# Patient Record
Sex: Male | Born: 1950 | Race: Black or African American | Hispanic: No | Marital: Married | Smoking: Current every day smoker
Health system: Southern US, Community
[De-identification: ages and names within clinical notes are randomized; demographics above are authoritative.]

## PROBLEM LIST (undated history)

## (undated) DIAGNOSIS — I10 Essential (primary) hypertension: Secondary | ICD-10-CM

## (undated) DIAGNOSIS — J45909 Unspecified asthma, uncomplicated: Secondary | ICD-10-CM

## (undated) HISTORY — PX: ACHILLES TENDON SURGERY: SHX542

## (undated) HISTORY — PX: TONSILLECTOMY: SUR1361

## (undated) HISTORY — PX: COLON RESECTION: SHX5231

---

## 1997-11-26 ENCOUNTER — Emergency Department (HOSPITAL_COMMUNITY): Admission: EM | Admit: 1997-11-26 | Discharge: 1997-11-26 | Payer: Self-pay | Admitting: Emergency Medicine

## 1998-07-27 ENCOUNTER — Emergency Department (HOSPITAL_COMMUNITY): Admission: EM | Admit: 1998-07-27 | Discharge: 1998-07-27 | Payer: Self-pay

## 2000-11-15 ENCOUNTER — Emergency Department (HOSPITAL_COMMUNITY): Admission: EM | Admit: 2000-11-15 | Discharge: 2000-11-15 | Payer: Self-pay | Admitting: *Deleted

## 2000-11-17 ENCOUNTER — Encounter: Payer: Self-pay | Admitting: Specialist

## 2000-11-20 ENCOUNTER — Observation Stay (HOSPITAL_COMMUNITY): Admission: RE | Admit: 2000-11-20 | Discharge: 2000-11-21 | Payer: Self-pay | Admitting: Specialist

## 2006-09-27 ENCOUNTER — Inpatient Hospital Stay (HOSPITAL_COMMUNITY): Admission: RE | Admit: 2006-09-27 | Discharge: 2006-10-02 | Payer: Self-pay | Admitting: Surgery

## 2006-09-27 ENCOUNTER — Encounter (INDEPENDENT_AMBULATORY_CARE_PROVIDER_SITE_OTHER): Payer: Self-pay | Admitting: Specialist

## 2008-12-05 ENCOUNTER — Emergency Department (HOSPITAL_COMMUNITY): Admission: EM | Admit: 2008-12-05 | Discharge: 2008-12-05 | Payer: Self-pay | Admitting: Emergency Medicine

## 2010-10-29 NOTE — Discharge Summary (Signed)
NAME:  Kyle Vazquez, Kyle Vazquez NO.:  [id number]   MEDICAL RECORD NO.:  [id number]          PATIENT TYPE:  INP   LOCATION:  1619                         FACILITY:  [facility]   PHYSICIAN:  Clovis Pu. Cornett, M.D.DATE OF BIRTH:  Mar 10, 1951   DATE OF ADMISSION:  09/27/2006  DATE OF DISCHARGE:  10/02/2006                               DISCHARGE SUMMARY   ADMITTING DIAGNOSIS:  Dysplastic right colon polyp.   DISCHARGE DIAGNOSIS:  Dysplastic right colon polyp.   PROCEDURE PERFORMED:  Laparoscopic-assisted right hemicolectomy.   BRIEF HISTORY:  The patient is a 60 year old male who was found to have  a dysplastic polyp in his right colon by Dr. Elnoria Howard.  This was biopsied  and showed mild to severe dysplasia.  Unfortunately, this is too large  of a polyp to remove colonoscopically, and he was brought to the  operating room for elective right hemicolectomy.   HOSPITAL COURSE:  The patient underwent a laparoscopic-assisted right  hemicolectomy on September 27, 2006.  His postoperative course was  relatively unremarkable.  He had a postoperative ileus for his first 3  postoperative days.  On postoperative day #4, he began passing gas  through the venous valves.  He was placed on a full liquid diet which he  tolerated well.  On postoperative day #5, he was ambulating, moving his  bowels.  He was able to pass his urine without difficulty.  He tolerated  a full liquid diet without nausea or vomiting and had stable vital  signs.  His wound was clean, dry and intact.  Abdomen was soft and  nontender on postoperative day #5.  He was discharged home in  satisfactory condition on postoperative day #5.   DISCHARGE INSTRUCTIONS:  The patient will take Vicodin for pain 1-2  tablets p.o. q.4h. p.r.n. pain.  He was resume his home medications of  Norvasc, aspirin and fish oil as before.  The dose of the Norvasc was 10  mg a day, fish oil was 2 tablets a day and aspirin 81 mg a day.   CONDITION AT  DISCHARGE:  Improved.      Thomas A. Cornett, M.D.  Electronically Signed     TAC/MEDQ  D:  10/02/2006  T:  10/02/2006  Job:  [id number]   cc:   Jordan Hawks. Elnoria Howard, MD  Fax: [phone]

## 2010-10-29 NOTE — Op Note (Signed)
NAME:  Kyle Vazquez, Kyle Vazquez NO.:  [id number]   MEDICAL RECORD NO.:  [id number]          PATIENT TYPE:  INP   LOCATION:  0005                         FACILITY:  [facility]   PHYSICIAN:  Clovis Pu. 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   PROCEDURE:  Laparoscopic assisted right hemicolectomy.   SURGEON:  Maisie Fus A. Cornett, M.D.   ASSISTANT:  Wilmon Arms. Tsuei, M.D.  Nurse.   DRAINS:  None.   ESTIMATED BLOOD LOSS:  150 mL.   ANESTHESIA:  General endotracheal anesthesia.   INDICATIONS FOR PROCEDURE:  The patient is a 60 year old male found to  have a dysplastic polyp in his right colon.  Biopsies showed dysplasia.  It was too large to be resected colonoscopically and he presents today  for right hemicolectomy.  We discussed the procedure with him as well  the use of laparoscopy to assist [place] and he agreed to proceed.  The risks  and complications were discussed with the patient.   DESCRIPTION OF PROCEDURE:  The patient was brought to the operating room  and placed supine.  After the induction of general anesthesia, the  abdomen was prepped and draped in a sterile fashion.  A Foley catheter  was placed as well as an orogastric tube.  A 1 cm supraumbilical  incision was made.  Dissection was carried down to his fascia.  Of note,  we pulled the left arm board in until it was flush to with his body.  After we dissected down, we opened the fascia and placed a pursestring  suture of 0 Vicryl.  I was able to place my finger through the  peritoneal lining into the  abdominal cavity without difficulty.  A 12 mm  Hassan cannula was placed under direct vision.  Laparoscopy was then  performed after insufflation of the abdominal cavity with 15 mmHg  pressure CO2.  I then placed a 5 mm port in the upper midline and a  second 5 mm port halfway between the umbilicus and pubic symphysis.   The cecum was quite floppy upon examination.  There was no evidence of  any disease in the liver or elsewhere in the abdominal cavity.  I began  to mobilize the cecum along the white line of Toldt using the harmonic  scalpel and did this all way up the right until we encountered the  hepatic flexure.  We had to mobilize the appendix off the  retroperitoneum carefully.  There appeared to be plenty of play in the  colon.  We were able to mobilize the entire colon up the white line of  Toldt.  We then took the hepatic flexure down with the harmonic scalpel.  We identified the duodenum and stayed well away  from that.  We dissected  well down around the duodenum and had this well visualized.  The  remainder of the hepatic flexure was taken down as well as the omentum  which we took off the colon to help mobilize this.  This appeared to be  quite floppy at this point.  We were down around the kidney and actually  did a pretty good job of kocherizing the duodenum.  There was some  concern that the ureter may have been injured here and we went ahead and  gave methylene blue.  We saw no extravasation of methylene blue.   At this point in time, I felt the hand port could be placed and we used  the applied port for access.  At this point in time, we made roughly an  8 cm incision from the umbilicus up toward the pubic symphysis.  We  opened the fascia and then placed our applied hand port as a wound  protector.  I then was able to reach in and grab the cecum and pulled  this out with the terminal ileum.  We were tethered, though, at the  terminal ileum.  We were also still tethered  at the transverse colon  coming down, but we could get most of it down.  Given the amount of  tethering and there was also some oozing from the retroperitoneum where  our resection was, I felt at this point that I would extend my incision  and remove the hand port.  The hand port was removed, I extended my  incision down just below the umbilicus and another 3 cm up toward the  pubic symphysis.  We then put a retractor in and I was able to pull the  colon up and see where I was tethered and then used the cautery and the  LigaSure to help free up the remainder of the hepatic flexure and  transverse colon.   I then got a very good look at the duodenum.  There were some small  areas of ecchymoses on the duodenum.  I saw no evidence of thermal  injury, though, or other injury to the duodenum or any areas where the  serosal was torn.  There was significant oozing, though, from the  lateral attachments where the colon was taken away from the  retroperitoneum with the harmonic scalpel.  I was able to put my hand  around the kidney and see that we had not violated Gerota's fascia or  come anywhere near the ureter which I figured laparoscopically.  I saw  no extravasation of methylene blue. The ureter was palpated and we were  well away from that.  The duodenum appeared to have come up with the  colon specimen somewhat and had kocherized somewhat.  This was done  laparoscopically it looked like, but again I saw no evidence of injury  to the pancreas or duodenum except for some small punctate areas of  ecchymoses.  He had considerable oozing from this area despite  irrigation and cautery.  I held a sponge there for pressure and then  applied Tisseel which did very nice job of controlling the oozing from  his right upper retroperitoneal region.   After I did this, I was able to then find the lesion which was in the mid ascending colon and was well tattooed and marked and palpable.  I  divided the  colon, at this point, just proximal to the middle colic  artery at the hepatic  flexure.  I then used the LigaSure and took the  mesentery down with the LigaSure until I got to the cecum.  Unfortunately, the appendix and distal ileum was tethered way into the  pelvis.  This was not appreciated laparoscopically.  I used the cautery  to help mobilize this up into the wound better which made the case much  easier at this point.  Once this was mobilized further, I used the  LigaSure to divide the remaining mesentery.  A GIA-75 stapler was used  to divide the terminal ileum at the ligament of Treeves.  I then  reinspected the retroperitoneum and saw excellent hemostasis at this  point, especially in the area up around the duodenum.   We then constructed a side-to-side functional end-to-end anastomosis  with a GIA-75 stapler and TA-60 stapling device to close the enterotomy.  Unfortunately, he had considerable oozing from the staple lines and I  had to oversew staple lines with 3-0 Vicryl suture for hemostasis.  A  stitch was placed in the crotch of the anastomosis, as well.  He still  had some oozing which stopped with pressure.  Irrigation was done prior  to placing the Tisseel and this was clear without signs of methylene  blue or sulcus.  At this point, the anastomosis was tension free and was  widely patent to two fingers.  I then placed the anastomosis in the  right upper quadrant and ran the small bowel from distal to proximal to  the ligament of Treitz and saw no twisting.  I laid this all back in the  abdominal cavity.   A sponge count was done and also I palpated and felt no retained sponges  and the sponge count was correct.  I pulled the omentum down over the  wound.  Of note, I palpated the liver and felt no evidence of mass.  The  gallbladder was grossly normal.  At this point, I closed the fascia  using a running #1 PDS and the skin was closed  with staples.  The two 5 mm port  sites were closed with staples, as  well.  All final counts of sponge, needle, and instruments were found to  be correct at this portion of the case.  The patient was then awakened  after placement of a dry dressing and taken to recovery in satisfactory  condition.      Thomas A. Cornett, M.D.  Electronically Signed     TAC/MEDQ  D:  09/27/2006  T:  09/27/2006  Job:  [id number]   cc:   Jordan Hawks. Elnoria Howard, MD  Fax: [phone]

## 2010-10-29 NOTE — Op Note (Signed)
[facility]  Patient:    Kyle Vazquez, Kyle Vazquez                   MRN: [id number] Proc. Date: 11/20/00 Adm. Date:  [id number] Attending:  Pierce Crane                           Operative Report  PREOPERATIVE DIAGNOSIS:  Achilles tendon tear left.  POSTOPERATIVE DIAGNOSIS:  Achilles tendon tear left.  PROCEDURE:  Repair of Achilles tendon and application of short leg cast.  ANESTHESIA:  General.  BRIEF HISTORY AND INDICATION:  A 60 year old who sustained and Achilles tendon tear, was seen in the office, no plantar flexion, palpable gap in Achilles tendon approximately 10 cm above the ______ .  Operative intervention is indicated for repair.  Dorsiflexion did not alter the gap.  The risks and benefits discussed including bleeding, infection, damage to vascular structures, recurrent tear, suboptimal range of motion, etc.  DESCRIPTION OF PROCEDURE:  The patient is in supine position.  After the induction of adequate general anesthesia, the patient placed prone with a bump underneath the ankle.  Left lower extremity was prepped and draped and exsanguinated in the usual sterile fashion.  Thigh tourniquet inflated to 350 mmHg.  A posterior incision curvilinear upon the calf, coursing medially, was made through the skin.  Subcutaneous tissue was dissected.  Neurovascular structures were meticulously spared.  Electrocautery was utilized to achieve hemostasis.  The peroneal nerve was identified and gently retracted and protected throughout the case.  The fascia overlying the gastroc-soleus complex was divided.  There was a large area 10 cm above its insertion palpable gap.  The peritenon was incised.  A large, organized hematoma was noted.  The gap was approximately 2 cm.  There was significant splitting and fraying of either end of the tendon.  With maximal dorsiflexion, after debridement the ends were unopposable by approximately 1 cm.  The incision  was extended cephalad, again, sparing the neurovascular elements.  The relaxing incisions were made proximally to advance the gastroc-soleus complex distally. This approximated the ends.  Then the ends were abraded and freshened.  Again, there was significant splaying of the noted, consistent with this type of an injury.  Next, the new Acufex suture tape was utilized.  A running baseball-type stitch was utilized for both the proximal and distal portion of the tendon.  Four separate baseball stitches were utilized.  They were brought together in the midline and with tension applied and the ends reapproximated, the baseball stitches were reapproximated at the tear site with excellent apposition noted.  The repair was reinforced with 0 Vicryl interrupted sutures. An excellent repair was noted.  The foot in maximal plantar flexion, the wound copiously irrigated.  Next, the peritenon reapproximated with 2-0 Vicryl simple sutures.  Fascia reapproximated with 0 Vicryl simple sutures. The subcutaneous tissue reapproximated with 2-0 Vicryl simple sutures.  The skin was reapproximated with staples.  No significant tension was noted on the repair site with the foot in plantar flexion.  Next, the wound was dressed sterilely.  A short leg fiberglass cast was applied in plantar flexion, well molded.  Prior to this, tourniquet was deflated.  There was adequate vascularization of the lower extremity appreciated.  The patient tolerated the procedure well, and there were no complications. Tourniquet time was one hour. DD:  11/20/00 TD:  11/20/00 Job: [id number]

## 2014-05-13 DEATH — deceased

## 2015-04-05 ENCOUNTER — Emergency Department (HOSPITAL_COMMUNITY)

## 2015-04-05 ENCOUNTER — Encounter (HOSPITAL_COMMUNITY): Payer: Self-pay | Admitting: Emergency Medicine

## 2015-04-05 ENCOUNTER — Inpatient Hospital Stay (HOSPITAL_COMMUNITY)
Admission: EM | Admit: 2015-04-05 | Discharge: 2015-04-09 | DRG: 605 | Disposition: A | Discharge status: Home / Self Care | Attending: General Surgery | Admitting: General Surgery

## 2015-04-05 DIAGNOSIS — Y92410 Unspecified street and highway as the place of occurrence of the external cause: Secondary | ICD-10-CM

## 2015-04-05 DIAGNOSIS — R55 Syncope and collapse: Secondary | ICD-10-CM | POA: Diagnosis present

## 2015-04-05 DIAGNOSIS — D62 Acute posthemorrhagic anemia: Secondary | ICD-10-CM | POA: Diagnosis not present

## 2015-04-05 DIAGNOSIS — N179 Acute kidney failure, unspecified: Secondary | ICD-10-CM | POA: Diagnosis not present

## 2015-04-05 DIAGNOSIS — S7001XA Contusion of right hip, initial encounter: Secondary | ICD-10-CM | POA: Diagnosis present

## 2015-04-05 DIAGNOSIS — F17200 Nicotine dependence, unspecified, uncomplicated: Secondary | ICD-10-CM | POA: Diagnosis present

## 2015-04-05 DIAGNOSIS — R32 Unspecified urinary incontinence: Secondary | ICD-10-CM | POA: Diagnosis present

## 2015-04-05 DIAGNOSIS — I10 Essential (primary) hypertension: Secondary | ICD-10-CM | POA: Diagnosis present

## 2015-04-05 DIAGNOSIS — S0990XA Unspecified injury of head, initial encounter: Secondary | ICD-10-CM

## 2015-04-05 DIAGNOSIS — R7989 Other specified abnormal findings of blood chemistry: Secondary | ICD-10-CM | POA: Diagnosis present

## 2015-04-05 DIAGNOSIS — T148XXA Other injury of unspecified body region, initial encounter: Secondary | ICD-10-CM

## 2015-04-05 HISTORY — DX: Unspecified asthma, uncomplicated: J45.909

## 2015-04-05 HISTORY — DX: Essential (primary) hypertension: I10

## 2015-04-05 LAB — HEMOGLOBIN AND HEMATOCRIT, BLOOD
HCT: 36.7 % — ABNORMAL LOW (ref 39.0–52.0)
HEMOGLOBIN: 12.6 g/dL — AB (ref 13.0–17.0)

## 2015-04-05 LAB — BASIC METABOLIC PANEL
Anion gap: 8 (ref 5–15)
BUN: 17 mg/dL (ref 6–20)
CALCIUM: 8.5 mg/dL — AB (ref 8.9–10.3)
CO2: 21 mmol/L — ABNORMAL LOW (ref 22–32)
CREATININE: 1.76 mg/dL — AB (ref 0.61–1.24)
Chloride: 109 mmol/L (ref 101–111)
GFR calc Af Amer: 46 mL/min — ABNORMAL LOW (ref >60)
GFR, EST NON AFRICAN AMERICAN: 40 mL/min — AB (ref >60)
Glucose, Bld: 155 mg/dL — ABNORMAL HIGH (ref 65–99)
Potassium: 4.6 mmol/L (ref 3.5–5.1)
SODIUM: 138 mmol/L (ref 135–145)

## 2015-04-05 LAB — CBC WITH DIFFERENTIAL/PLATELET
Basophils Absolute: 0 10*3/uL (ref 0.0–0.1)
Basophils Relative: 0 %
EOS ABS: 0 10*3/uL (ref 0.0–0.7)
EOS PCT: 0 %
HCT: 39.9 % (ref 39.0–52.0)
Hemoglobin: 13.8 g/dL (ref 13.0–17.0)
LYMPHS ABS: 0.6 10*3/uL — AB (ref 0.7–4.0)
Lymphocytes Relative: 4 %
MCH: 26.7 pg (ref 26.0–34.0)
MCHC: 34.6 g/dL (ref 30.0–36.0)
MCV: 77.2 fL — ABNORMAL LOW (ref 78.0–100.0)
MONOS PCT: 4 %
Monocytes Absolute: 0.7 10*3/uL (ref 0.1–1.0)
Neutro Abs: 17.1 10*3/uL — ABNORMAL HIGH (ref 1.7–7.7)
Neutrophils Relative %: 92 %
PLATELETS: 199 10*3/uL (ref 150–400)
RBC: 5.17 MIL/uL (ref 4.22–5.81)
RDW: 13 % (ref 11.5–15.5)
WBC: 18.4 10*3/uL — AB (ref 4.0–10.5)

## 2015-04-05 MED ORDER — ONDANSETRON HCL 4 MG/2ML IJ SOLN
4.0000 mg | Freq: Four times a day (QID) | INTRAMUSCULAR | Status: DC | PRN
  Administered 2015-04-06 – 2015-04-07 (×2): 4 mg via INTRAVENOUS
  Filled 2015-04-05: qty 2

## 2015-04-05 MED ORDER — SODIUM CHLORIDE 0.9 % IV SOLN
Freq: Once | INTRAVENOUS | Status: AC
  Administered 2015-04-05: 21:00:00 via INTRAVENOUS

## 2015-04-05 MED ORDER — OXYCODONE HCL 5 MG PO TABS
5.0000 mg | ORAL_TABLET | ORAL | Status: DC | PRN
  Filled 2015-04-05: qty 1

## 2015-04-05 MED ORDER — IOHEXOL 300 MG/ML  SOLN
100.0000 mL | Freq: Once | INTRAMUSCULAR | Status: AC | PRN
Start: 2015-04-05 — End: 2015-04-05
  Administered 2015-04-05: 80 mL via INTRAVENOUS

## 2015-04-05 MED ORDER — SODIUM CHLORIDE 0.9 % IV BOLUS (SEPSIS)
1000.0000 mL | Freq: Once | INTRAVENOUS | Status: AC
  Administered 2015-04-05: 1000 mL via INTRAVENOUS

## 2015-04-05 MED ORDER — SODIUM CHLORIDE 0.9 % IV SOLN
Freq: Once | INTRAVENOUS | Status: AC
  Administered 2015-04-05: 23:00:00 via INTRAVENOUS

## 2015-04-05 MED ORDER — ONDANSETRON HCL 4 MG PO TABS: 4.0000 mg | ORAL_TABLET | Freq: Four times a day (QID) | ORAL | Status: DC | PRN

## 2015-04-05 MED ORDER — ONDANSETRON HCL 4 MG/2ML IJ SOLN
4.0000 mg | Freq: Once | INTRAMUSCULAR | Status: AC
  Administered 2015-04-05: 4 mg via INTRAVENOUS
  Filled 2015-04-05: qty 2

## 2015-04-05 MED ORDER — IBUPROFEN 400 MG PO TABS
600.0000 mg | ORAL_TABLET | Freq: Once | ORAL | Status: AC
  Administered 2015-04-05: 600 mg via ORAL
  Filled 2015-04-05 (×2): qty 1

## 2015-04-05 NOTE — ED Notes (Signed)
Dr Radford PaxBeaton upgraded patient to Level 2 trauma.
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
## 2015-04-05 NOTE — ED Notes (Addendum)
Patient became very diaphoretic and pressure and pulse dropped.  Dr Sharlene Mottsameriez notified
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
## 2015-04-05 NOTE — ED Notes (Addendum)
Attempted to ambulate pt, upon standing pt became lightheaded and had a syncopal episode. Pt assisted to the ground, no head injury. Charge RN Clydie BraunKaren and Dr. Radford PaxBeaton notified and evaluated pt. Pt placed on LSB by multiple staff members and placed onto ED stretcher.

## 2015-04-05 NOTE — ED Notes (Signed)
Patient to CT with this RN.

## 2015-04-06 ENCOUNTER — Inpatient Hospital Stay (HOSPITAL_COMMUNITY)

## 2015-04-06 LAB — CBC WITH DIFFERENTIAL/PLATELET
Basophils Absolute: 0 10*3/uL (ref 0.0–0.1)
Basophils Relative: 0 %
EOS ABS: 0 10*3/uL (ref 0.0–0.7)
EOS PCT: 0 %
HCT: 31.6 % — ABNORMAL LOW (ref 39.0–52.0)
Hemoglobin: 11.1 g/dL — ABNORMAL LOW (ref 13.0–17.0)
LYMPHS ABS: 0.8 10*3/uL (ref 0.7–4.0)
Lymphocytes Relative: 7 %
MCH: 27.5 pg (ref 26.0–34.0)
MCHC: 35.1 g/dL (ref 30.0–36.0)
MCV: 78.2 fL (ref 78.0–100.0)
MONO ABS: 0.4 10*3/uL (ref 0.1–1.0)
MONOS PCT: 4 %
Neutro Abs: 11.4 10*3/uL — ABNORMAL HIGH (ref 1.7–7.7)
Neutrophils Relative %: 89 %
PLATELETS: 175 10*3/uL (ref 150–400)
RBC: 4.04 MIL/uL — ABNORMAL LOW (ref 4.22–5.81)
RDW: 13.2 % (ref 11.5–15.5)
WBC: 12.7 10*3/uL — ABNORMAL HIGH (ref 4.0–10.5)

## 2015-04-06 LAB — CBC
HCT: 33.7 % — ABNORMAL LOW (ref 39.0–52.0)
HEMATOCRIT: 28.6 % — AB (ref 39.0–52.0)
Hemoglobin: 11.5 g/dL — ABNORMAL LOW (ref 13.0–17.0)
Hemoglobin: 9.7 g/dL — ABNORMAL LOW (ref 13.0–17.0)
MCH: 26.6 pg (ref 26.0–34.0)
MCH: 26.4 pg (ref 26.0–34.0)
MCHC: 34.1 g/dL (ref 30.0–36.0)
MCHC: 33.9 g/dL (ref 30.0–36.0)
MCV: 78 fL (ref 78.0–100.0)
MCV: 77.7 fL — AB (ref 78.0–100.0)
PLATELETS: 187 10*3/uL (ref 150–400)
Platelets: 213 10*3/uL (ref 150–400)
RBC: 4.32 MIL/uL (ref 4.22–5.81)
RBC: 3.68 MIL/uL — ABNORMAL LOW (ref 4.22–5.81)
RDW: 13.1 % (ref 11.5–15.5)
RDW: 13.2 % (ref 11.5–15.5)
WBC: 12 10*3/uL — ABNORMAL HIGH (ref 4.0–10.5)
WBC: 12.7 10*3/uL — AB (ref 4.0–10.5)

## 2015-04-06 LAB — PROTIME-INR
INR: 1.25 (ref 0.00–1.49)
INR: 1.35 (ref 0.00–1.49)
PROTHROMBIN TIME: 15.8 s — AB (ref 11.6–15.2)
PROTHROMBIN TIME: 16.8 s — AB (ref 11.6–15.2)

## 2015-04-06 LAB — APTT: aPTT: 21 seconds — ABNORMAL LOW (ref 24–37)

## 2015-04-06 LAB — BASIC METABOLIC PANEL
Anion gap: 9 (ref 5–15)
BUN: 17 mg/dL (ref 6–20)
CO2: 20 mmol/L — ABNORMAL LOW (ref 22–32)
CREATININE: 1.83 mg/dL — AB (ref 0.61–1.24)
Calcium: 7.9 mg/dL — ABNORMAL LOW (ref 8.9–10.3)
Chloride: 112 mmol/L — ABNORMAL HIGH (ref 101–111)
GFR, EST AFRICAN AMERICAN: 44 mL/min — AB (ref >60)
GFR, EST NON AFRICAN AMERICAN: 38 mL/min — AB (ref >60)
Glucose, Bld: 148 mg/dL — ABNORMAL HIGH (ref 65–99)
Potassium: 4.7 mmol/L (ref 3.5–5.1)
SODIUM: 141 mmol/L (ref 135–145)

## 2015-04-06 LAB — ABO/RH: ABO/RH(D): B POS

## 2015-04-06 LAB — GLUCOSE, CAPILLARY: Glucose-Capillary: 179 mg/dL — ABNORMAL HIGH (ref 65–99)

## 2015-04-06 LAB — PREPARE RBC (CROSSMATCH)

## 2015-04-06 LAB — MRSA PCR SCREENING: MRSA by PCR: NEGATIVE

## 2015-04-06 MED ORDER — ACETAMINOPHEN 325 MG PO TABS
650.0000 mg | ORAL_TABLET | ORAL | Status: DC | PRN
  Administered 2015-04-06 – 2015-04-08 (×3): 650 mg via ORAL
  Filled 2015-04-06 (×3): qty 2

## 2015-04-06 MED ORDER — SODIUM CHLORIDE 0.9 % IV SOLN
INTRAVENOUS | Status: DC
  Administered 2015-04-06: 02:00:00 via INTRAVENOUS

## 2015-04-06 MED ORDER — SODIUM CHLORIDE 0.9 % IV SOLN: Freq: Once | INTRAVENOUS | Status: DC

## 2015-04-06 MED ORDER — ONDANSETRON HCL 4 MG/2ML IJ SOLN: 4.0000 mg | INTRAMUSCULAR | Status: DC | PRN

## 2015-04-06 NOTE — ED Notes (Signed)
Abd binder applied
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
## 2015-04-06 NOTE — Progress Notes (Signed)
Went to patients room because of alarm pt was posturing with head up to then right eyes rolled back. Pt was extremely diaphoretic and had vomited a large amount of emesis.   I asked pt if he was ok he did not respond after approximately 1 minute  His gaze cleared and I asked him again if he was ok he said yea that he was just sweating. I then told pt he had vomited VSS Dr Derrell Lollingamirez  Notified  New orders received will continue to monitor.

## 2015-04-06 NOTE — Progress Notes (Addendum)
Pt feels his upper thigh is more swollen and tighter than it has been. Pt denies pain BP 127/71 and HR 94. Dr. Ophelia CharterYates notified. New orders received to apply ice. Will continue to monitor.
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
## 2015-04-06 NOTE — Progress Notes (Signed)
Patient up to chair with physical therapy. Upon standing pt's pressure dropped to 70's/60's, pt then became diaphoretic.  Patient placed in chair with legs elevated. BP came back to 110's/70s. Upon assessment of patient, patient had soiled himself at some point this morning, and was not aware of it. Will continue to monitor patient.

## 2015-04-06 NOTE — Progress Notes (Signed)
Patient ID: Kyle Vazquez, male   DOB: 1950-06-24, 64 y.o.   MRN: [id number]    Subjective: Soar R thigh 2/10 pain, hungry  Objective: Vital signs in last 24 hours: Temp:  [97.4 F (36.3 C)-98.8 F (37.1 C)] 97.4 F (36.3 C) (10/24 0758) Pulse Rate:  [49-93] 75 (10/24 0410) Resp:  [9-21] 13 (10/24 0800) BP: (57-167)/(37-98) 97/54 mmHg (10/24 0800) SpO2:  [92 %-100 %] 98 % (10/24 0800) Weight:  [99.791 kg (220 lb)-104.6 kg (230 lb 9.6 oz)] 104.6 kg (230 lb 9.6 oz) (10/24 0012)    Intake/Output from previous day: 10/23 0701 - 10/24 0700 In: 5500 [I.V.:5500] Out: 450 [Urine:450] Intake/Output this shift:    General appearance: alert and cooperative Resp: clear to auscultation bilaterally Cardio: regular rate and rhythm GI: soft, non-tender; bowel sounds normal; no masses,  no organomegaly Extremities: large R lateral thigh and hip hematoma Neurologic: Mental status: Alert, oriented, thought content appropriate  Lab Results: CBC   Recent Labs  04/05/15 1915 04/05/15 2143 04/05/15 2340  WBC 18.4*  --  12.7*  HGB 13.8 12.6* 11.1*  HCT 39.9 36.7* 31.6*  PLT 199  --  175   BMET  Recent Labs  04/05/15 1915  NA 138  K 4.6  CL 109  CO2 21*  GLUCOSE 155*  BUN 17  CREATININE 1.76*  CALCIUM 8.5*   PT/INR  Recent Labs  04/05/15 2340  LABPROT 16.8*  INR 1.35   Anti-infectives: Anti-infectives    None      [facility]  Large sub cut hematoma R hip and thigh - ace and binder, Hb now 11.5. Check this PM. Stabilizing. FEN - reg diet and KVO IVF ABL anemia - from hematoma Syncope - due to above, monitor Dispo - SDU, mobilize, PT/OT  LOS: 1 day    Kyle GelinasBurke Jolly Bleicher, MD, MPH, FACS Trauma: [phone] General Surgery: [phone]  04/06/2015

## 2015-04-07 ENCOUNTER — Encounter (HOSPITAL_COMMUNITY): Payer: Self-pay | Admitting: Certified Registered"

## 2015-04-07 ENCOUNTER — Inpatient Hospital Stay (HOSPITAL_COMMUNITY): Admitting: Anesthesiology

## 2015-04-07 ENCOUNTER — Encounter (HOSPITAL_COMMUNITY): Admission: EM | Disposition: A | Discharge status: Home / Self Care | Payer: Self-pay | Source: Home / Self Care

## 2015-04-07 DIAGNOSIS — R7989 Other specified abnormal findings of blood chemistry: Secondary | ICD-10-CM | POA: Diagnosis present

## 2015-04-07 DIAGNOSIS — D62 Acute posthemorrhagic anemia: Secondary | ICD-10-CM | POA: Diagnosis not present

## 2015-04-07 HISTORY — PX: INCISION AND DRAINAGE HIP: SHX1801

## 2015-04-07 LAB — CBC
HCT: 24.7 % — ABNORMAL LOW (ref 39.0–52.0)
HEMATOCRIT: 22.6 % — AB (ref 39.0–52.0)
HEMOGLOBIN: 8.6 g/dL — AB (ref 13.0–17.0)
Hemoglobin: 7.9 g/dL — ABNORMAL LOW (ref 13.0–17.0)
MCH: 26.9 pg (ref 26.0–34.0)
MCH: 27.7 pg (ref 26.0–34.0)
MCHC: 35 g/dL (ref 30.0–36.0)
MCHC: 34.8 g/dL (ref 30.0–36.0)
MCV: 76.9 fL — AB (ref 78.0–100.0)
MCV: 79.4 fL (ref 78.0–100.0)
PLATELETS: 147 10*3/uL — AB (ref 150–400)
PLATELETS: 152 10*3/uL (ref 150–400)
RBC: 2.94 MIL/uL — ABNORMAL LOW (ref 4.22–5.81)
RBC: 3.11 MIL/uL — ABNORMAL LOW (ref 4.22–5.81)
RDW: 13.2 % (ref 11.5–15.5)
RDW: 13.9 % (ref 11.5–15.5)
WBC: 12 10*3/uL — AB (ref 4.0–10.5)
WBC: 11.9 10*3/uL — ABNORMAL HIGH (ref 4.0–10.5)

## 2015-04-07 LAB — PREPARE RBC (CROSSMATCH)

## 2015-04-07 SURGERY — IRRIGATION AND DEBRIDEMENT HIP
Anesthesia: General | Site: Hip | Laterality: Right

## 2015-04-07 MED ORDER — MIDAZOLAM HCL 5 MG/5ML IJ SOLN
INTRAMUSCULAR | Status: DC | PRN
  Administered 2015-04-07: 2 mg via INTRAVENOUS

## 2015-04-07 MED ORDER — SODIUM CHLORIDE 0.9 % IR SOLN
Status: DC | PRN
  Administered 2015-04-07: 3000 mL

## 2015-04-07 MED ORDER — FENTANYL CITRATE (PF) 100 MCG/2ML IJ SOLN
INTRAMUSCULAR | Status: DC | PRN
  Administered 2015-04-07 (×2): 50 ug via INTRAVENOUS

## 2015-04-07 MED ORDER — LACTATED RINGERS IV SOLN
INTRAVENOUS | Status: DC
  Administered 2015-04-07 (×2): via INTRAVENOUS

## 2015-04-07 MED ORDER — LIDOCAINE HCL (CARDIAC) 20 MG/ML IV SOLN
INTRAVENOUS | Status: DC | PRN
  Administered 2015-04-07: 100 mg via INTRAVENOUS

## 2015-04-07 MED ORDER — SODIUM CHLORIDE 0.9 % IV SOLN
Freq: Once | INTRAVENOUS | Status: AC
  Administered 2015-04-07: 10 mL/h via INTRAVENOUS

## 2015-04-07 MED ORDER — MIDAZOLAM HCL 2 MG/2ML IJ SOLN
INTRAMUSCULAR | Status: AC
  Filled 2015-04-07: qty 4

## 2015-04-07 MED ORDER — ONDANSETRON HCL 4 MG/2ML IJ SOLN
INTRAMUSCULAR | Status: AC
  Filled 2015-04-07: qty 2

## 2015-04-07 MED ORDER — HYDROMORPHONE HCL 1 MG/ML IJ SOLN: 0.2500 mg | INTRAMUSCULAR | Status: DC | PRN

## 2015-04-07 MED ORDER — CEFAZOLIN SODIUM-DEXTROSE 2-3 GM-% IV SOLR
INTRAVENOUS | Status: DC | PRN
  Administered 2015-04-07: 2 g via INTRAVENOUS

## 2015-04-07 MED ORDER — LIDOCAINE HCL (CARDIAC) 20 MG/ML IV SOLN
INTRAVENOUS | Status: AC
  Filled 2015-04-07: qty 5

## 2015-04-07 MED ORDER — PROPOFOL 10 MG/ML IV BOLUS
INTRAVENOUS | Status: DC | PRN
  Administered 2015-04-07: 150 mg via INTRAVENOUS

## 2015-04-07 MED ORDER — PROPOFOL 10 MG/ML IV BOLUS
INTRAVENOUS | Status: AC
  Filled 2015-04-07: qty 20

## 2015-04-07 MED ORDER — 0.9 % SODIUM CHLORIDE (POUR BTL) OPTIME
TOPICAL | Status: DC | PRN
  Administered 2015-04-07: 1000 mL

## 2015-04-07 MED ORDER — FENTANYL CITRATE (PF) 250 MCG/5ML IJ SOLN
INTRAMUSCULAR | Status: AC
  Filled 2015-04-07: qty 5

## 2015-04-07 MED ORDER — CEFAZOLIN SODIUM 1-5 GM-% IV SOLN
1.0000 g | Freq: Three times a day (TID) | INTRAVENOUS | Status: AC
  Administered 2015-04-08 (×3): 1 g via INTRAVENOUS
  Filled 2015-04-07 (×5): qty 50

## 2015-04-07 SURGICAL SUPPLY — 45 items
BAG DECANTER FOR FLEXI CONT (MISCELLANEOUS) IMPLANT
COVER SURGICAL LIGHT HANDLE (MISCELLANEOUS) ×3 IMPLANT
DRAPE IMP U-DRAPE 54X76 (DRAPES) ×3 IMPLANT
DRAPE ORTHO SPLIT 77X108 STRL (DRAPES) ×4
DRAPE SURG ORHT 6 SPLT 77X108 (DRAPES) ×2 IMPLANT
DRAPE U-SHAPE 47X51 STRL (DRAPES) ×3 IMPLANT
DRSG ADAPTIC 3X8 NADH LF (GAUZE/BANDAGES/DRESSINGS) ×3 IMPLANT
DRSG MEPILEX BORDER 4X4 (GAUZE/BANDAGES/DRESSINGS) ×6 IMPLANT
DRSG MEPILEX BORDER 4X8 (GAUZE/BANDAGES/DRESSINGS) ×3 IMPLANT
DRSG PAD ABDOMINAL 8X10 ST (GAUZE/BANDAGES/DRESSINGS) ×3 IMPLANT
DURAPREP 26ML APPLICATOR (WOUND CARE) ×3 IMPLANT
ELECT CAUTERY BLADE 6.4 (BLADE) IMPLANT
ELECT REM PT RETURN 9FT ADLT (ELECTROSURGICAL)
ELECTRODE REM PT RTRN 9FT ADLT (ELECTROSURGICAL) IMPLANT
GAUZE SPONGE 4X4 12PLY STRL (GAUZE/BANDAGES/DRESSINGS) ×3 IMPLANT
GAUZE XEROFORM 5X9 LF (GAUZE/BANDAGES/DRESSINGS) ×3 IMPLANT
GLOVE BIO SURGEON STRL SZ8 (GLOVE) ×3 IMPLANT
GLOVE BIOGEL PI IND STRL 8 (GLOVE) ×1 IMPLANT
GLOVE BIOGEL PI INDICATOR 8 (GLOVE) ×2
GLOVE ORTHO TXT STRL SZ7.5 (GLOVE) ×3 IMPLANT
GOWN STRL REUS W/ TWL LRG LVL3 (GOWN DISPOSABLE) ×1 IMPLANT
GOWN STRL REUS W/ TWL XL LVL3 (GOWN DISPOSABLE) ×4 IMPLANT
GOWN STRL REUS W/TWL LRG LVL3 (GOWN DISPOSABLE) ×2
GOWN STRL REUS W/TWL XL LVL3 (GOWN DISPOSABLE) ×8
HANDPIECE INTERPULSE COAX TIP (DISPOSABLE)
KIT BASIN OR (CUSTOM PROCEDURE TRAY) ×3 IMPLANT
KIT ROOM TURNOVER OR (KITS) ×3 IMPLANT
MANIFOLD NEPTUNE II (INSTRUMENTS) ×3 IMPLANT
NS IRRIG 1000ML POUR BTL (IV SOLUTION) ×3 IMPLANT
PACK TOTAL JOINT (CUSTOM PROCEDURE TRAY) ×3 IMPLANT
PACK UNIVERSAL I (CUSTOM PROCEDURE TRAY) ×3 IMPLANT
PAD ARMBOARD 7.5X6 YLW CONV (MISCELLANEOUS) ×6 IMPLANT
SET HNDPC FAN SPRY TIP SCT (DISPOSABLE) IMPLANT
SPONGE LAP 18X18 X RAY DECT (DISPOSABLE) ×3 IMPLANT
STAPLER VISISTAT 35W (STAPLE) ×3 IMPLANT
SUT ETHILON 2 0 FS 18 (SUTURE) IMPLANT
SUT VIC AB 1 CTB1 27 (SUTURE) IMPLANT
SUT VIC AB 2-0 CT1 27 (SUTURE) ×2
SUT VIC AB 2-0 CT1 TAPERPNT 27 (SUTURE) ×1 IMPLANT
SUT VICRYL 0 CT 1 36IN (SUTURE) ×3 IMPLANT
TOWEL OR 17X24 6PK STRL BLUE (TOWEL DISPOSABLE) ×3 IMPLANT
TOWEL OR 17X26 10 PK STRL BLUE (TOWEL DISPOSABLE) ×3 IMPLANT
TUBE ANAEROBIC SPECIMEN COL (MISCELLANEOUS) IMPLANT
UNDERPAD 30X30 INCONTINENT (UNDERPADS AND DIAPERS) ×3 IMPLANT
WATER STERILE IRR 1000ML POUR (IV SOLUTION) ×3 IMPLANT

## 2015-04-07 NOTE — Progress Notes (Signed)
Met with pt and wife, per their request to discuss dc plans.  PT recommending no OP follow up.  Wife requesting hospital bed for home; pt states he does not want it.  She states she wants pt to be comfortable, but he states he will be comfortable on the couch, if needed.  PT states that pt has done so well that he will likely be able to get up stairs.  She states she will reevaluate after pt's surgery.  Pt and wife continue to argue about getting hospital bed.  Frankly, I am not sure that pt's insurance will cover hospital bed for his diagnosis.    Pt states "if you get it, I won't sleep in it."  Wife eventually gave in.Marland KitchenMarland KitchenStates we will wait and have PT evaluate post surgery to see how he does.  I stated that PT  could even work on stairs, if needed prior to dc.  Wife and pt agreeable to plan.  We will reassess for DME post surgery, per PT recommendations.    Reinaldo Raddle, RN, BSN  Trauma/Neuro ICU Case Manager [phone]

## 2015-04-07 NOTE — Progress Notes (Signed)
Upon assessment of patient this am, pt's sheets were soiled. Pt stated he did not spill his urinal, and was unaware he had soiled the bed. MD aware, pt cleaned up and new bed linens applied. Will continue to monitor.

## 2015-04-07 NOTE — Progress Notes (Signed)
Patient ID: Kyle Vazquez, male   DOB: [birth year], 64 y.o.   MRN: [id number] Due to continued right thigh swelling as well as a continued drop in his hemaglobin, will proceed to the OR this afternoon for evacuation of the right thigh hematoma and exploration of the soft-tissues.  I have spoken to him and his wife about this in detail and have communicated this to the Trauma Service.  He also understands fully that he may need a blood transfusion as well.  Will make him NPO at this point until surgery.  His right this is diffusely swollen, but he shows no signs of symptoms of compartment syndrome.
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
## 2015-04-07 NOTE — Anesthesia Procedure Notes (Signed)
Procedure Name: LMA Insertion Date/Time: 04/07/2015 4:51 PM Performed by: Arlice ColtMANESS, Larrisha Babineau B Pre-anesthesia Checklist: Patient identified, Emergency Drugs available, Suction available, Patient being monitored and Timeout performed Patient Re-evaluated:Patient Re-evaluated prior to inductionOxygen Delivery Method: Circle system utilized Preoxygenation: Pre-oxygenation with 100% oxygen Intubation Type: IV induction LMA: LMA inserted LMA Size: 5.0 Number of attempts: 1 Placement Confirmation: positive ETCO2 and breath sounds checked- equal and bilateral Tube secured with: Tape Dental Injury: Teeth and Oropharynx as per pre-operative assessment

## 2015-04-07 NOTE — Anesthesia Postprocedure Evaluation (Signed)
  Anesthesia Post-op Note  Patient: Kyle Vazquez  Procedure(s) Performed: Procedure(s): EVACUATION HEMATOMA RIGHT HIP/THIGH (Right)  Patient Location: PACU  Anesthesia Type: General   Level of Consciousness: awake, alert  and oriented  Airway and Oxygen Therapy: Patient Spontanous Breathing  Post-op Pain: none  Post-op Assessment: Post-op Vital signs reviewed  Post-op Vital Signs: Reviewed  Last Vitals:  Filed Vitals:   04/07/15 1818  BP:   Pulse:   Temp: 36.8 C  Resp:     Complications: No apparent anesthesia complications
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
## 2015-04-07 NOTE — Anesthesia Preprocedure Evaluation (Addendum)
Anesthesia Evaluation  Patient identified by MRN, date of birth, ID band Patient awake    Reviewed: Allergy & Precautions, NPO status , Patient's Chart, lab work & pertinent test results  History of Anesthesia Complications Negative for: history of anesthetic complications  Airway Mallampati: II  TM Distance: >3 FB Neck ROM: Full    Dental  (+) Teeth Intact, Dental Advisory Given   Pulmonary asthma , Current Smoker,    Pulmonary exam normal        Cardiovascular hypertension, Pt. on medications Normal cardiovascular exam     Neuro/Psych    GI/Hepatic   Endo/Other    Renal/GU      Musculoskeletal   Abdominal   Peds  Hematology   Anesthesia Other Findings   Reproductive/Obstetrics                            Anesthesia Physical Anesthesia Plan  ASA: II  Anesthesia Plan: General   Post-op Pain Management:    Induction: Intravenous  Airway Management Planned: LMA  Additional Equipment:   Intra-op Plan:   Post-operative Plan: Extubation in OR  Informed Consent: I have reviewed the patients History and Physical, chart, labs and discussed the procedure including the risks, benefits and alternatives for the proposed anesthesia with the patient or authorized representative who has indicated his/her understanding and acceptance.   Dental advisory given  Plan Discussed with: Anesthesiologist and Surgeon  Anesthesia Plan Comments:         Anesthesia Quick Evaluation

## 2015-04-07 NOTE — Progress Notes (Signed)
Patient ID: Kyle PrudeWilliam F Vazquez, male   DOB: [birth year], 64 y.o.   MRN: [id number]   LOS: 2 days   Subjective: No change in condition, minimal pain   Objective: Vital signs in last 24 hours: Temp:  [97.4 F (36.3 C)-99.5 F (37.5 C)] 99.1 F (37.3 C) (10/25 0327) Resp:  [13-25] 23 (10/25 0700) BP: (73-127)/(50-80) 124/69 mmHg (10/25 0700) SpO2:  [91 %-98 %] 95 % (10/25 0400)    Laboratory  CBC  Recent Labs  04/06/15 1030 04/07/15 0319  WBC 12.7* 12.0*  HGB 9.7* 7.9*  HCT 28.6* 22.6*  PLT 213 147*    Physical Exam General appearance: alert and no distress Resp: clear to auscultation bilaterally Cardio: regular rate and rhythm GI: normal findings: bowel sounds normal and soft, non-tender   Assessment/Plan: [facility]  Large sub cut hematoma R hip and thigh - Hgb has steadily dropped over the last 18h, plan for surgery today by Dr. Magnus IvanBlackman ABL anemia - monitor, may need blood today and/or tomorrow Syncope - due to above, monitor AKI -- Maybe superimposed on chronic, maybe all chronic. Check tomorrow. FEN - reg diet and KVO IVF VTE -- SCD's Dispo - Continue SDU today    Freeman CaldronMichael J. Sybol Morre, PA-C Pager: [phone] General Trauma PA Pager: [phone]  04/07/2015

## 2015-04-07 NOTE — Transfer of Care (Signed)
Immediate Anesthesia Transfer of Care Note  Patient: Kyle PrudeWilliam F Siegmann  Procedure(s) Performed: Procedure(s): EVACUATION HEMATOMA RIGHT HIP/THIGH (Right)  Patient Location: PACU  Anesthesia Type:General  Level of Consciousness: awake, alert , oriented and patient cooperative  Airway & Oxygen Therapy: Patient Spontanous Breathing and Patient connected to nasal cannula oxygen  Post-op Assessment: Report given to RN and Post -op Vital signs reviewed and stable  Post vital signs: Reviewed and stable  Last Vitals:  Filed Vitals:   04/07/15 1430  BP: 108/58  Pulse: 82  Temp:   Resp: 21    Complications: No apparent anesthesia complications

## 2015-04-07 NOTE — Brief Op Note (Signed)
04/05/2015 - 04/07/2015  5:41 PM  PATIENT:  Kyle Vazquez  64 y.o. male  PRE-OPERATIVE DIAGNOSIS:  right hip/thigh hematoma  POST-OPERATIVE DIAGNOSIS:  right hip/thigh hematoma  PROCEDURE:  Procedure(s): EVACUATION HEMATOMA RIGHT HIP/THIGH (Right)  SURGEON:  Surgeon(s) and Role:    * Kathryne Hitchhristopher Y Dreden Rivere, MD - Primary  PHYSICIAN ASSISTANT: Rexene EdisonGil Clark, PA-C  ANESTHESIA:   general  EBL:  Total I/O In: 1335 [I.V.:1000; Blood:335] Out: -   BLOOD ADMINISTERED:none  DRAINS: (2 medium) Hemovact drain(s) in the thigh with  Suction Open   LOCAL MEDICATIONS USED:  NONE  SPECIMEN:  No Specimen  DISPOSITION OF SPECIMEN:  N/A  COUNTS:  YES  TOURNIQUET:  * No tourniquets in log *  DICTATION: .Other Dictation: Dictation Number [id number]  PLAN OF CARE: Admit to inpatient   PATIENT DISPOSITION:  PACU - hemodynamically stable.   Delay start of Pharmacological VTE agent (>24hrs) due to surgical blood loss or risk of bleeding: no

## 2015-04-08 ENCOUNTER — Encounter (HOSPITAL_COMMUNITY): Payer: Self-pay | Admitting: Orthopaedic Surgery

## 2015-04-08 LAB — CBC
HEMATOCRIT: 21.7 % — AB (ref 39.0–52.0)
HEMOGLOBIN: 7.5 g/dL — AB (ref 13.0–17.0)
MCH: 27.5 pg (ref 26.0–34.0)
MCHC: 34.6 g/dL (ref 30.0–36.0)
MCV: 79.5 fL (ref 78.0–100.0)
PLATELETS: 148 10*3/uL — AB (ref 150–400)
RBC: 2.73 MIL/uL — ABNORMAL LOW (ref 4.22–5.81)
RDW: 13.8 % (ref 11.5–15.5)
WBC: 11.2 10*3/uL — ABNORMAL HIGH (ref 4.0–10.5)

## 2015-04-08 LAB — BASIC METABOLIC PANEL
ANION GAP: 9 (ref 5–15)
BUN: 19 mg/dL (ref 6–20)
CALCIUM: 8.2 mg/dL — AB (ref 8.9–10.3)
CO2: 24 mmol/L (ref 22–32)
Chloride: 104 mmol/L (ref 101–111)
Creatinine, Ser: 1.72 mg/dL — ABNORMAL HIGH (ref 0.61–1.24)
GFR, EST AFRICAN AMERICAN: 47 mL/min — AB (ref >60)
GFR, EST NON AFRICAN AMERICAN: 41 mL/min — AB (ref >60)
Glucose, Bld: 128 mg/dL — ABNORMAL HIGH (ref 65–99)
POTASSIUM: 4.4 mmol/L (ref 3.5–5.1)
SODIUM: 137 mmol/L (ref 135–145)

## 2015-04-08 MED ORDER — POLYETHYLENE GLYCOL 3350 17 G PO PACK
17.0000 g | PACK | Freq: Every day | ORAL | Status: DC
Start: 2015-04-08 — End: 2015-04-09
  Administered 2015-04-08 – 2015-04-09 (×2): 17 g via ORAL
  Filled 2015-04-08 (×2): qty 1

## 2015-04-08 MED ORDER — OXYCODONE HCL 5 MG PO TABS: 5.0000 mg | ORAL_TABLET | ORAL | Status: DC | PRN

## 2015-04-08 MED ORDER — DOCUSATE SODIUM 100 MG PO CAPS
100.0000 mg | ORAL_CAPSULE | Freq: Two times a day (BID) | ORAL | Status: DC
  Administered 2015-04-08 – 2015-04-09 (×3): 100 mg via ORAL
  Filled 2015-04-08 (×3): qty 1
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
## 2015-04-08 NOTE — Progress Notes (Addendum)
On second attempt, report given to RN Danielle on unit 5North, pt will transfer to 5N21 and continue to be on telemetry monitoring per MD orders. Pt and wife; at bedside, informed of new room number and nurse's name whom will take over care for pt.

## 2015-04-08 NOTE — Progress Notes (Signed)
Pt transferred to 5N21 via wheelchair with belongings, wife at pt's side.

## 2015-04-08 NOTE — Progress Notes (Signed)
OT Cancellation and discharge Note  Patient Details Name: Lynnae PrudeWilliam F Cyphers MRN: [id number] DOB: 22-Nov-1950   Cancelled Treatment:    Reason Eval/Treat Not Completed: Pt is supervision with mobility post surgery.  He was able to perform ADLs yesterday at supervision level and now post surgery functioning at that same level or better.  He has good family support and will quickly progress to modified independence.   Will sign off at this time.  Pt agreeable.    Angelene GiovanniConarpe, Vikkie Goeden M  Anjel Perfetti [place], OTR/L [phone]  04/08/2015, 4:27 PM

## 2015-04-08 NOTE — Progress Notes (Signed)
Patient ID: Kyle Vazquez, male   DOB: [birth year], 64 y.o.   MRN: [id number]   LOS: 3 days   Subjective: Feels about the same as yesterday.   Objective: Vital signs in last 24 hours: Temp:  [97.4 F (36.3 C)-98.9 F (37.2 C)] 98.5 F (36.9 C) (10/26 0753) Pulse Rate:  [76-101] 81 (10/26 0800) Resp:  [11-37] 13 (10/26 0800) BP: (101-148)/(47-80) 117/63 mmHg (10/26 0800) SpO2:  [93 %-100 %] 99 % (10/26 0800)    Hemovac #1: 26330ml/insertion Hemovac #2: 7210ml/insertion   Laboratory  CBC  Recent Labs  04/07/15 1900 04/08/15 0257  WBC 11.9* 11.2*  HGB 8.6* 7.5*  HCT 24.7* 21.7*  PLT 152 148*   BMET  Recent Labs  04/06/15 0349 04/08/15 0257  NA 141 137  K 4.7 4.4  CL 112* 104  CO2 20* 24  GLUCOSE 148* 128*  BUN 17 19  CREATININE 1.83* 1.72*  CALCIUM 7.9* 8.2*    Physical Exam General appearance: alert and no distress Resp: clear to auscultation bilaterally Cardio: regular rate and rhythm GI: normal findings: bowel sounds normal and soft, non-tender   Assessment/Plan: [facility]  Large sub cut hematoma R hip and thigh s/p I&D  ABL anemia - down another gram after surgery, monitor Syncope - due to above, monitor AKI -- Maybe superimposed on chronic, maybe all chronic. Improved today. FEN - No issues VTE -- SCD's Dispo - Transfer to tele    Freeman CaldronMichael J. Avelardo Reesman, PA-C Pager: [phone] General Trauma PA Pager: [phone]  04/08/2015
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
## 2015-04-09 DIAGNOSIS — R32 Unspecified urinary incontinence: Secondary | ICD-10-CM | POA: Diagnosis not present

## 2015-04-09 LAB — BASIC METABOLIC PANEL
Anion gap: 6 (ref 5–15)
BUN: 17 mg/dL (ref 6–20)
CO2: 25 mmol/L (ref 22–32)
Calcium: 8.1 mg/dL — ABNORMAL LOW (ref 8.9–10.3)
Chloride: 104 mmol/L (ref 101–111)
Creatinine, Ser: 1.52 mg/dL — ABNORMAL HIGH (ref 0.61–1.24)
GFR calc Af Amer: 55 mL/min — ABNORMAL LOW (ref >60)
GFR calc non Af Amer: 47 mL/min — ABNORMAL LOW (ref >60)
Glucose, Bld: 133 mg/dL — ABNORMAL HIGH (ref 65–99)
Potassium: 3.9 mmol/L (ref 3.5–5.1)
Sodium: 135 mmol/L (ref 135–145)

## 2015-04-09 LAB — CBC
HCT: 22.8 % — ABNORMAL LOW (ref 39.0–52.0)
Hemoglobin: 7.7 g/dL — ABNORMAL LOW (ref 13.0–17.0)
MCH: 27.3 pg (ref 26.0–34.0)
MCHC: 33.8 g/dL (ref 30.0–36.0)
MCV: 80.9 fL (ref 78.0–100.0)
Platelets: 167 10*3/uL (ref 150–400)
RBC: 2.82 MIL/uL — ABNORMAL LOW (ref 4.22–5.81)
RDW: 14.3 % (ref 11.5–15.5)
WBC: 10.7 10*3/uL — ABNORMAL HIGH (ref 4.0–10.5)

## 2015-04-09 LAB — TYPE AND SCREEN
ABO/RH(D): B POS
Antibody Screen: NEGATIVE
UNIT DIVISION: 0
Unit division: 0

## 2015-04-09 MED ORDER — FERROUS SULFATE 325 (65 FE) MG PO TABS: 325.0000 mg | ORAL_TABLET | Freq: Every day | ORAL | Status: DC

## 2015-04-09 MED ORDER — POLYETHYLENE GLYCOL 3350 17 G PO PACK: 17.0000 g | PACK | Freq: Every day | ORAL | Status: DC

## 2015-04-09 MED ORDER — ACETAMINOPHEN 325 MG PO TABS: 650.0000 mg | ORAL_TABLET | ORAL | Status: DC | PRN

## 2015-04-09 MED ORDER — DOCUSATE SODIUM 100 MG PO CAPS
100.0000 mg | ORAL_CAPSULE | Freq: Two times a day (BID) | ORAL | Status: DC | PRN
Start: 2015-04-09 — End: 2023-12-20

## 2015-04-09 NOTE — Progress Notes (Signed)
Patient ID: Kyle PrudeWilliam F Vazquez, male   DOB: 01-Jul-1950, 64 y.o.   MRN: [id number] I removed both hemovac drains at this standpoint due to decreasing output.  Thigh swelling still there and to be expected.  New dressing applied.  No further intervention of now from my standpoint.  H/H stable when compared to previous day.  He has been up more as well and is much more comfortable.  Would still be carefull getting up without some assistance if light-headed.
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
## 2015-04-09 NOTE — Care Management Note (Signed)
Case Management Note  Patient Details  Name: Lynnae PrudeWilliam F Tesoriero MRN: [id number] Date of Birth: Jan 29, 1951  Subjective/Objective:   Pt for dc home today with spouse.  PT/OT recommending no OP follow up or DME.                   Action/Plan: Pt ambulated stairs safely with PT without assistive device.  Will not need hospital bed for home.    Expected Discharge Date: 04/09/15    Expected Discharge Plan:  Home/Self Care  In-House Referral:     Discharge planning Services  CM Consult  Post Acute Care Choice:    Choice offered to:     DME Arranged:    DME Agency:     HH Arranged:    HH Agency:     Status of Service:  Completed, signed off  Medicare Important Message Given:    Date Medicare IM Given:    Medicare IM give by:    Date Additional Medicare IM Given:    Additional Medicare Important Message give by:     If discussed at Long Length of Stay Meetings, dates discussed:    Additional Comments:  Quintella BatonJulie W. Lauralei Clouse, RN, BSN  Trauma/Neuro ICU Case Manager [phone]

## 2015-04-09 NOTE — Discharge Instructions (Signed)
Alternate ice and heat right thigh several times daily. Can get current dressing wet in the shower. Remove current dressing in 3-4 days, then may get actual incision wet daily in the shower followed by a new dry dressing or large band-aids Increase activities as comfort allows  Hematoma A hematoma is a collection of blood under the skin, in an organ, in a body space, in a joint space, or in other tissue. The blood can clot to form a lump that you can see and feel. The lump is often firm and may sometimes become sore and tender. Most hematomas get better in a few days to weeks. However, some hematomas may be serious and require medical care. Hematomas can range in size from very small to very large. CAUSES  A hematoma can be caused by a blunt or penetrating injury. It can also be caused by spontaneous leakage from a blood vessel under the skin. Spontaneous leakage from a blood vessel is more likely to occur in older people, especially those taking blood thinners. Sometimes, a hematoma can develop after certain medical procedures. SIGNS AND SYMPTOMS   A firm lump on the body.  Possible pain and tenderness in the area.  Bruising.Blue, dark blue, purple-red, or yellowish skin may appear at the site of the hematoma if the hematoma is close to the surface of the skin. For hematomas in deeper tissues or body spaces, the signs and symptoms may be subtle. For example, an intra-abdominal hematoma may cause abdominal pain, weakness, fainting, and shortness of breath. An intracranial hematoma may cause a headache or symptoms such as weakness, trouble speaking, or a change in consciousness. DIAGNOSIS  A hematoma can usually be diagnosed based on your medical history and a physical exam. Imaging tests may be needed if your health care provider suspects a hematoma in deeper tissues or body spaces, such as the abdomen, head, or chest. These tests may include ultrasonography or a CT scan.  TREATMENT  Hematomas  usually go away on their own over time. Rarely does the blood need to be drained out of the body. Large hematomas or those that may affect vital organs will sometimes need surgical drainage or monitoring. HOME CARE INSTRUCTIONS   Apply ice to the injured area:   Put ice in a plastic bag.   Place a towel between your skin and the bag.   Leave the ice on for 20 minutes, 2-3 times a day for the first 1 to 2 days.   After the first 2 days, switch to using warm compresses on the hematoma.   Elevate the injured area to help decrease pain and swelling. Wrapping the area with an elastic bandage may also be helpful. Compression helps to reduce swelling and promotes shrinking of the hematoma. Make sure the bandage is not wrapped too tight.   If your hematoma is on a lower extremity and is painful, crutches may be helpful for a couple days.   Only take over-the-counter or prescription medicines as directed by your health care provider. SEEK IMMEDIATE MEDICAL CARE IF:   You have increasing pain, or your pain is not controlled with medicine.   You have a fever.   You have worsening swelling or discoloration.   Your skin over the hematoma breaks or starts bleeding.   Your hematoma is in your chest or abdomen and you have weakness, shortness of breath, or a change in consciousness.  Your hematoma is on your scalp (caused by a fall or injury) and you  have a worsening headache or a change in alertness or consciousness. MAKE SURE YOU:   Understand these instructions.  Will watch your condition.  Will get help right away if you are not doing well or get worse.   This information is not intended to replace advice given to you by your health care provider. Make sure you discuss any questions you have with your health care provider.   Document Released: 01/12/2004 Document Revised: 01/30/2013 Document Reviewed: 11/07/2012 Elsevier Interactive Patient Education [employer]

## 2018-07-04 ENCOUNTER — Ambulatory Visit
Admission: RE | Admit: 2018-07-04 | Discharge: 2018-07-04 | Disposition: A | Discharge status: Home / Self Care | Payer: Medicare Other | Source: Ambulatory Visit | Attending: Family Medicine | Admitting: Family Medicine

## 2018-07-04 ENCOUNTER — Other Ambulatory Visit: Payer: Self-pay | Admitting: Family Medicine

## 2018-07-04 DIAGNOSIS — R29898 Other symptoms and signs involving the musculoskeletal system: Secondary | ICD-10-CM

## 2018-07-09 ENCOUNTER — Other Ambulatory Visit: Payer: Self-pay | Admitting: Family Medicine

## 2018-07-09 DIAGNOSIS — M47816 Spondylosis without myelopathy or radiculopathy, lumbar region: Secondary | ICD-10-CM

## 2018-07-09 DIAGNOSIS — R29898 Other symptoms and signs involving the musculoskeletal system: Secondary | ICD-10-CM

## 2018-07-10 ENCOUNTER — Ambulatory Visit
Admission: RE | Admit: 2018-07-10 | Discharge: 2018-07-10 | Disposition: A | Discharge status: Home / Self Care | Payer: Medicare Other | Source: Ambulatory Visit | Attending: Family Medicine | Admitting: Family Medicine

## 2018-07-10 DIAGNOSIS — R29898 Other symptoms and signs involving the musculoskeletal system: Secondary | ICD-10-CM

## 2018-07-10 DIAGNOSIS — M47816 Spondylosis without myelopathy or radiculopathy, lumbar region: Secondary | ICD-10-CM

## 2019-05-08 ENCOUNTER — Other Ambulatory Visit: Payer: Self-pay

## 2019-05-08 DIAGNOSIS — Z20822 Contact with and (suspected) exposure to covid-19: Secondary | ICD-10-CM

## 2019-05-10 LAB — NOVEL CORONAVIRUS, NAA: SARS-CoV-2, NAA: NOT DETECTED

## 2019-07-14 ENCOUNTER — Ambulatory Visit

## 2019-07-19 ENCOUNTER — Ambulatory Visit: Payer: Medicare Other | Attending: Internal Medicine

## 2019-07-19 DIAGNOSIS — Z23 Encounter for immunization: Secondary | ICD-10-CM

## 2019-07-19 NOTE — Progress Notes (Signed)
   Covid-19 Vaccination Clinic  Name:  Kyle Vazquez    MRN: [id number] DOB: [birth year]  07/19/2019  Kyle Vazquez was observed post Covid-19 immunization for 15 minutes without incidence. He was provided with Vaccine Information Sheet and instruction to access the V-Safe system.   Kyle Vazquez was instructed to call 911 with any severe reactions post vaccine: Marland Kitchen Difficulty breathing  . Swelling of your face and throat  . A fast heartbeat  . A bad rash all over your body  . Dizziness and weakness    Immunizations Administered    Name Date Dose VIS Date Route   Pfizer COVID-19 Vaccine 07/19/2019 10:23 AM 0.3 mL 05/24/2019 Intramuscular   Manufacturer: [employer], [employer]   Lot: [id number]   NDC: [address]-5

## 2019-07-25 ENCOUNTER — Ambulatory Visit

## 2019-08-13 ENCOUNTER — Ambulatory Visit: Payer: Medicare Other | Attending: Internal Medicine

## 2019-08-13 DIAGNOSIS — Z23 Encounter for immunization: Secondary | ICD-10-CM | POA: Insufficient documentation

## 2019-08-13 NOTE — Progress Notes (Signed)
   Covid-19 Vaccination Clinic  Name:  Kyle Vazquez    MRN: [id number] DOB: 1951/01/02  08/13/2019  [name] was observed post Covid-19 immunization for 15 minutes without incident. He was provided with Vaccine Information Sheet and instruction to access the V-Safe system.   [name] was instructed to call 911 with any severe reactions post vaccine: Marland Kitchen Difficulty breathing  . Swelling of face and throat  . A fast heartbeat  . A bad rash all over body  . Dizziness and weakness   Immunizations Administered    Name Date Dose VIS Date Route   Pfizer COVID-19 Vaccine 08/13/2019 10:31 AM 0.3 mL 05/24/2019 Intramuscular   Manufacturer: [employer], [employer]   Lot: [id number]   NDC: [address]-1

## 2019-08-28 IMAGING — CR DG LUMBAR SPINE COMPLETE 4+V
5 series · 5 of 5 positions shown · non-contrast
Comparison: None.

CLINICAL DATA: Lumbago with right lower extremity radicular
symptoms

EXAM:
LUMBAR SPINE - COMPLETE 4+ VIEW

[w lumbar spine ap]
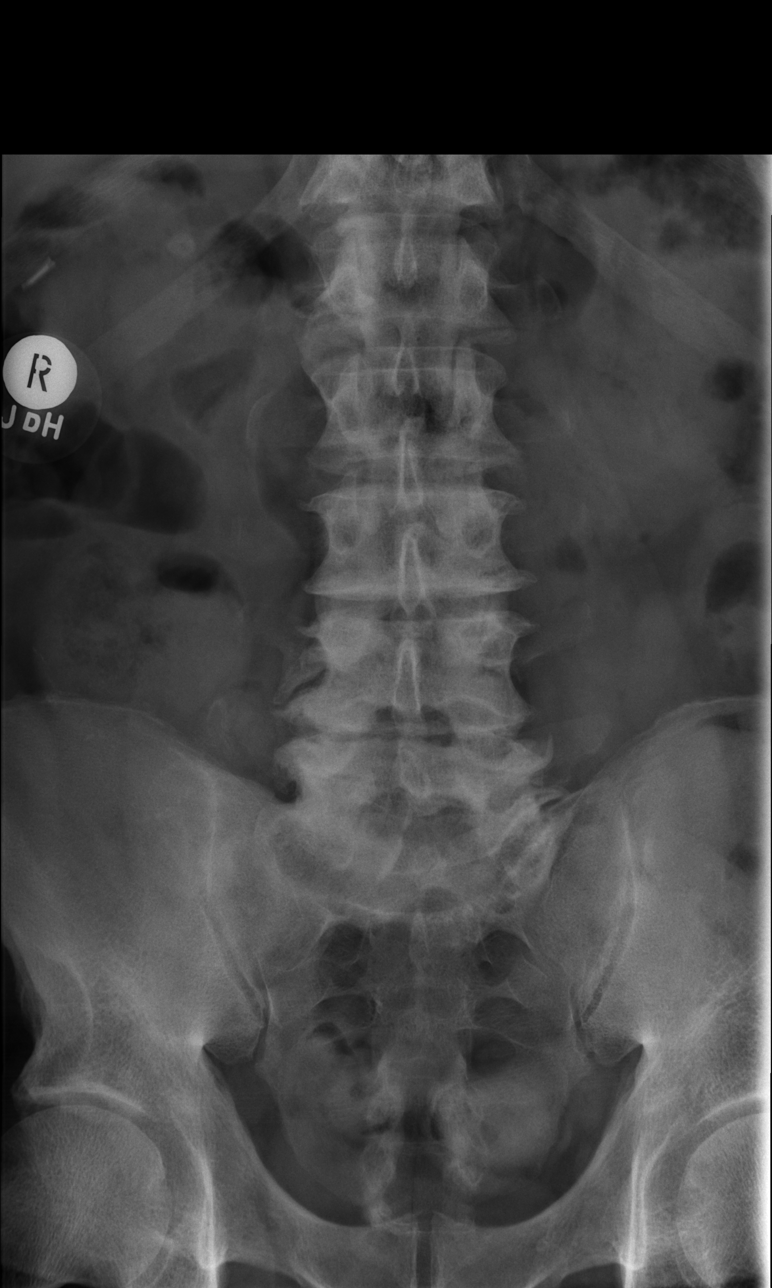

[w lumbar spine obl (1 of 2)]
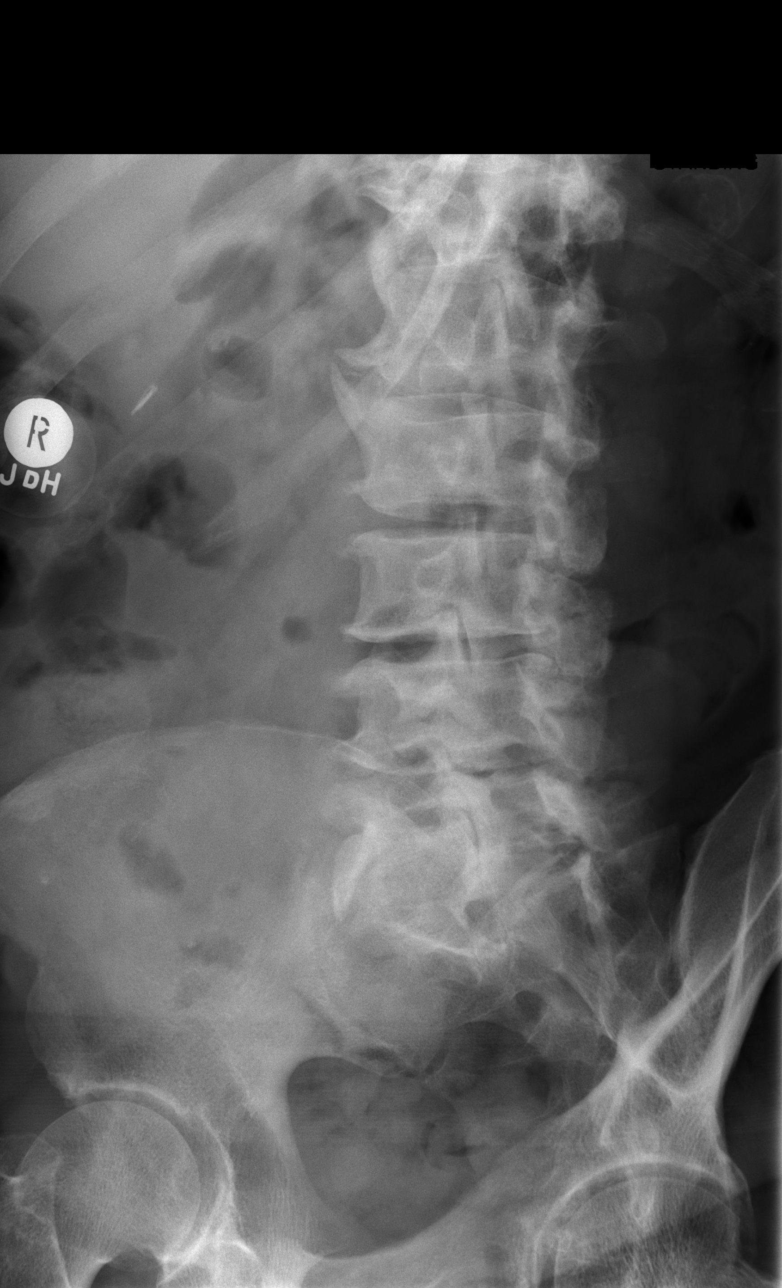

[w lumbar spine obl (2 of 2)]
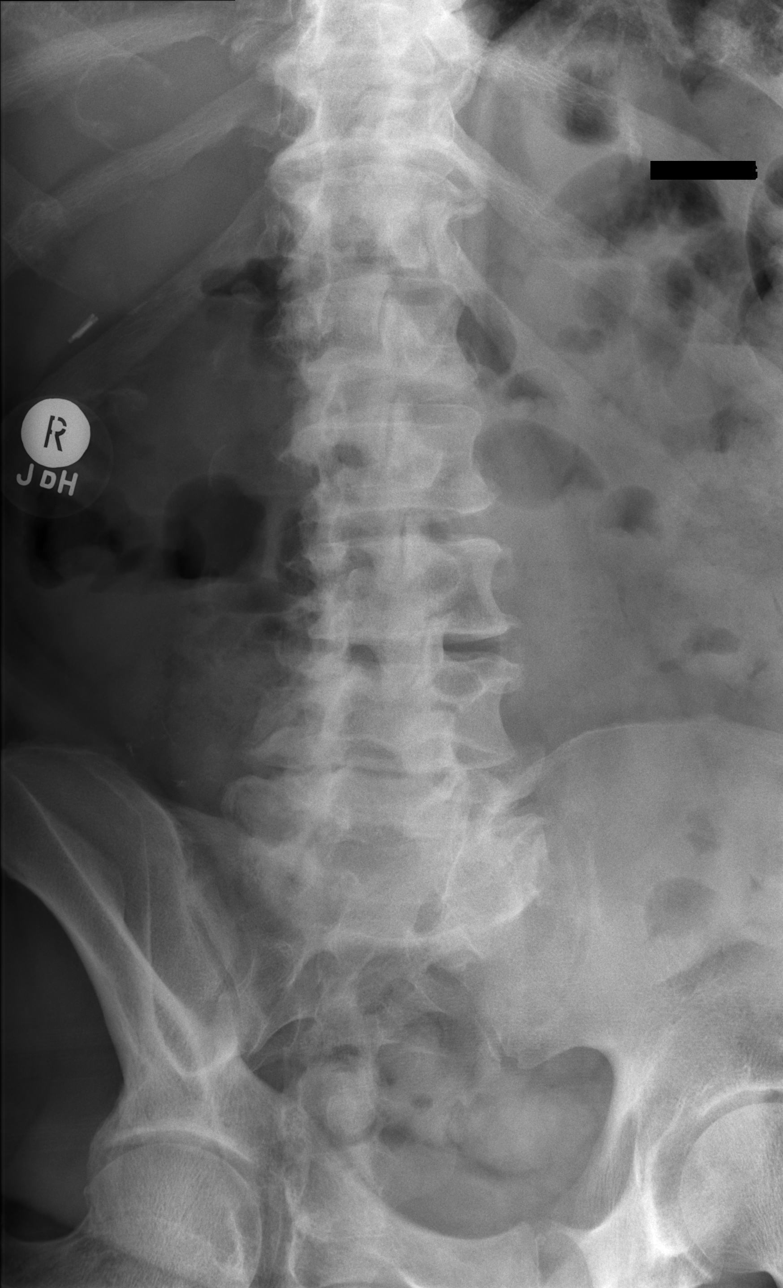

[w lumbar spine lat]
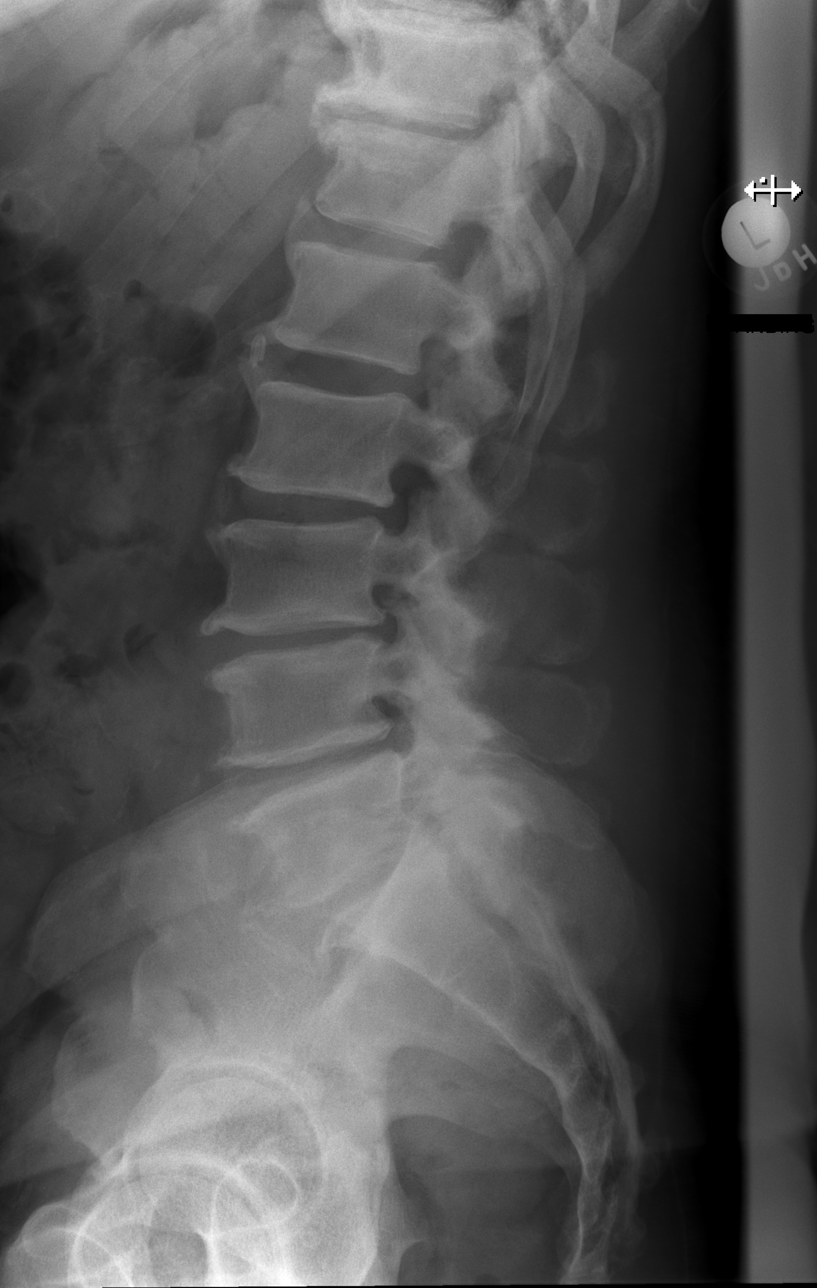

[w lumbar l-5 s-1 spot]
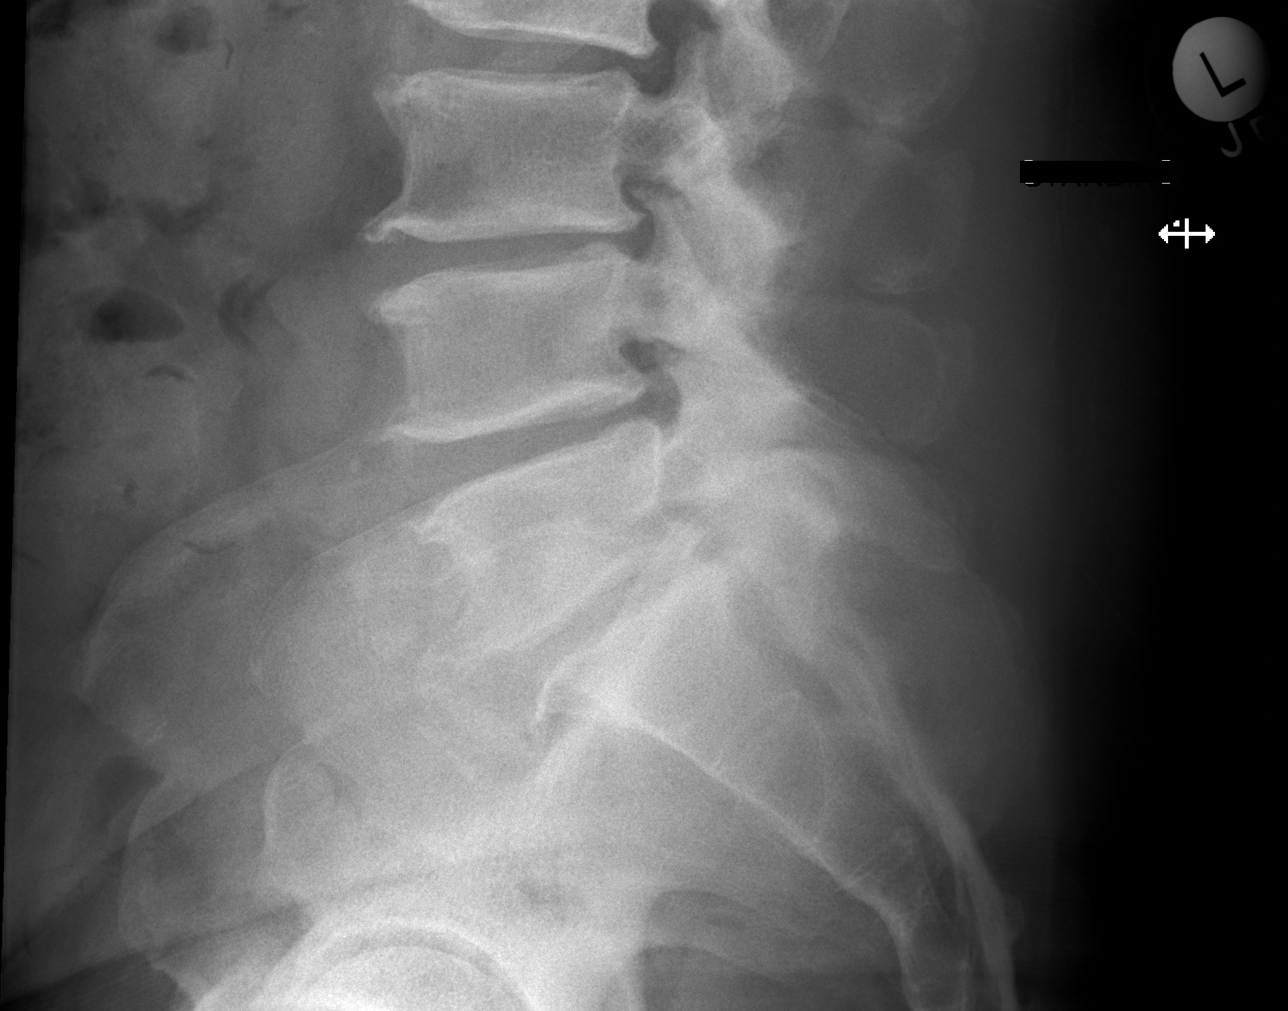

[5 of 5 positions shown; findings below may reference images not displayed]

FINDINGS: Standing frontal, standing lateral, standing spot lumbosacral
lateral, and standing bilateral oblique views were obtained. There
are 5 non-rib-bearing lumbar type vertebral bodies. There is no
fracture or spondylolisthesis. There is moderate disc space
narrowing at L3-4, L4-5, and L5-S1. There are anterior osteophytes
at all levels. There is facet osteoarthritic change at L4-5 and
L5-S1 bilaterally.
IMPRESSION: Osteoarthritic change at several levels. No fracture or
spondylolisthesis.

## 2020-05-01 ENCOUNTER — Ambulatory Visit: Payer: Medicare Other | Attending: Internal Medicine

## 2020-05-01 ENCOUNTER — Other Ambulatory Visit (HOSPITAL_BASED_OUTPATIENT_CLINIC_OR_DEPARTMENT_OTHER): Payer: Self-pay | Admitting: Internal Medicine

## 2020-05-01 DIAGNOSIS — Z23 Encounter for immunization: Secondary | ICD-10-CM

## 2020-05-01 NOTE — Progress Notes (Signed)
° °  Covid-19 Vaccination Clinic  Name:  Kyle Vazquez    MRN: [id number] DOB: [birth year]  05/01/2020  [name] was observed post Covid-19 immunization for 15 minutes without incident. He was provided with Vaccine Information Sheet and instruction to access the V-Safe system.   [name] was instructed to call 911 with any severe reactions post vaccine:  Difficulty breathing   Swelling of face and throat   A fast heartbeat   A bad rash all over body   Dizziness and weakness   Immunizations Administered    Name Date Dose VIS Date Route   Pfizer COVID-19 Vaccine 05/01/2020 10:44 AM 0.3 mL 04/01/2020 Intramuscular   Manufacturer: [employer], [employer]   Lot: [id number]   NDC: [address]-1

## 2020-12-16 ENCOUNTER — Ambulatory Visit: Payer: Medicare Other | Attending: Internal Medicine

## 2020-12-16 DIAGNOSIS — Z23 Encounter for immunization: Secondary | ICD-10-CM

## 2020-12-16 NOTE — Progress Notes (Signed)
   Covid-19 Vaccination Clinic  Name:  SHAARAV RIPPLE    MRN: [id number] DOB: [birth year]  12/16/2020  Mr. Baines was observed post Covid-19 immunization for 15 minutes without incident. He was provided with Vaccine Information Sheet and instruction to access the V-Safe system.   Mr. Bonawitz was instructed to call 911 with any severe reactions post vaccine: Difficulty breathing  Swelling of face and throat  A fast heartbeat  A bad rash all over body  Dizziness and weakness

## 2020-12-18 ENCOUNTER — Other Ambulatory Visit (HOSPITAL_BASED_OUTPATIENT_CLINIC_OR_DEPARTMENT_OTHER): Payer: Self-pay

## 2020-12-18 MED ORDER — COVID-19 MRNA VAC-TRIS(PFIZER) 30 MCG/0.3ML IM SUSP
INTRAMUSCULAR | 0 refills | Status: DC
  Filled 2020-12-18: qty 0.3, 1d supply, fill #0

## 2021-02-22 ENCOUNTER — Other Ambulatory Visit (HOSPITAL_BASED_OUTPATIENT_CLINIC_OR_DEPARTMENT_OTHER): Payer: Self-pay

## 2021-04-22 ENCOUNTER — Ambulatory Visit: Payer: Medicare Other | Attending: Internal Medicine

## 2021-04-22 DIAGNOSIS — Z23 Encounter for immunization: Secondary | ICD-10-CM

## 2021-04-22 NOTE — Progress Notes (Signed)
   Covid-19 Vaccination Clinic  Name:  NELVIN TOMB    MRN: [id number] DOB: 08-Dec-1950  04/22/2021  Mr. Turck was observed post Covid-19 immunization for 15 minutes without incident. He was provided with Vaccine Information Sheet and instruction to access the V-Safe system.   Mr. Eschbach was instructed to call 911 with any severe reactions post vaccine: Difficulty breathing  Swelling of face and throat  A fast heartbeat  A bad rash all over body  Dizziness and weakness   Immunizations Administered     Name Date Dose VIS Date Route   Pfizer Covid-19 Vaccine Bivalent Booster 04/22/2021  9:53 AM 0.3 mL 02/10/2021 Intramuscular   Manufacturer: [employer], [employer]   Lot: [id number]   NDC: [phone]

## 2021-05-11 ENCOUNTER — Other Ambulatory Visit (HOSPITAL_BASED_OUTPATIENT_CLINIC_OR_DEPARTMENT_OTHER): Payer: Self-pay

## 2021-05-11 MED ORDER — PFIZER COVID-19 VAC BIVALENT 30 MCG/0.3ML IM SUSP
INTRAMUSCULAR | 0 refills | Status: DC
  Filled 2021-05-11: qty 0.3, 1d supply, fill #0

## 2022-12-26 NOTE — Progress Notes (Signed)
 Office Visit Consult PCP: Prentice VEAR Blush, MD  Reason for Visit:   Palpitations, cardiac arrhythmia  Assessment/Plan   1. Essential hypertension   2. Heart palpitations   3. SVT (supraventricular tachycardia) (CMS/HCC)   4. Bigeminy   5. Stage 3a chronic kidney disease (CMS/HCC)   6. Frequent PVCs    Recommendations: 1.  The patient's main concern is regarding hypertension control.  He is working with primary care without medication adjustments had a long discussion with the patient and his wife regarding further sodium restriction, avoidance of nonsteroidal anti-inflammatory medications, and gradual careful up titration of his medical regimen.  Specifically the neck step I would make if blood pressure remains suboptimally controlled on present regimen would be to increase amlodipine  from 5 to 10 mg daily.  If remains suboptimal then addition of spironolactone or hydrochlorothiazide would be reasonable although more electrolyte monitoring will be required so as not to exacerbate his cardiac arrhythmia.  Also advised patient to start regular exercising more regularly and accordance with the standard preventative task force guidelines 150 mg a day of sustained moderate activity.  I will leave all blood pressure medication adjustments to primary care in addition I am uptitrating beta-blocker dosage slightly in hopes of further suppressing his cardiac arrhythmia this may also also help blood pressure control.  I believe this patient most likely has essential hypertension with the only secondary factors present at this time is moderate chronic kidney disease.  There is no evidence of renal artery disease or hyperaldosteronism at this point.  Patient should be able to achieve adequate blood pressure control with a combination of 2 or 3 medications along with above described lifestyle adjustments                                  2.  Stress test will be performed to rule out catecholamine mediated/exercise  provoked arrhythmia which by history he probably does not have.  This will also serve to exclude myocardial ischemia as a potential trigger for his ventricular arrhythmia.  I reassured the patient that the cardiac arrhythmia recorded thus far  contains no dangerous or life-threatening arrhythmia and are associated with essentially normal structural heart on echocardiogram.  There was a 6 beat run of wide-complex tachycardia which appears to be atrial tachycardia with ventricular aberrancy and not ventricular tachycardia he has rather frequent PVCs and some runs of bigeminy which can be lengthy and also PACs.  There was no atrial fibrillation or sustained ventricular arrhythmia.  I would gauge the patient's of symptoms is mild to moderate with possible slight temporization since addition of beta-blocker but its only been 2 weeks.  The arrhythmias do not typically delay sleep initiation or reduce his daytime activity tolerance the patient has curtailed his activity somewhat awaiting Mayen's further instructions today advised him to resume normal activities such as playing golf and then we can resume more rigorous activity level pending the stress test result  Thank you for allowing me to participate in this patient's cardiovascular care pending the PET the stress test results and the patient's symptomatic response to the above blood pressure medication adjustments, we will schedule him for follow-up as needed.  Orders   Orders Placed This Encounter  Procedures  . Stress test  . ECG 12 lead   No follow-ups on file.  HPI   Very pleasant 72 year old male with a background history of CKD stage IIIa and chronic  essential hypertension.  The patient is referred mainly from primary care to further evaluation management of ventricular and supraventricular arrhythmia  Patient states that approximately 6 to 8 weeks ago he started noticing more frequent palpitations which at this point occur a few times per day  usually not sustained symptoms sometimes a feeling of 2 extra beats in rapid succession.  No dizziness or syncope.  More so notes symptoms in the evening when he is lying down trying to go to sleep.  Not aware of that it really prevents him from going to sleep however.  He has no chest pain or shortness of breath he does not exercise as regularly as he should be does play golf regularly, usually riding in a golf cart.  When he is active during the day his arrhythmia symptoms are minimal.  Patient does not have a history of atherosclerotic disease  Lipid panel from 2021 showed an untreated LDL of 108 triglycerides normal HDL cholesterol normal.  The creatinine clearance clearance estimated is in the 47-50 range with a creatinine in the 1.5-1.6 range review of labs over the last few years show that these levels are had maintained a stable pattern  Patient states that he has been on blood pressure medications for many years and however the patient the blood pressure control is also worsened this year.  States that at home as systolic blood pressure not uncommonly exceeds 150 and in the upper 80s to low 90s diastolic  The patient's last for blood pressure home readings have been improved with pressures in the 1 35-1 42 range systolic and in the 80s diastolic this is a result of increasing the benazepril  component of his combination pill to 20 mg.  Low-dose metoprolol  was started for palpitations thus far the patient  has not noticed too much change in symptoms  Recent lab work was unremarkable normokalemic lower normal magnesium level at 1.8 normal TSH and T4   Physical Examination:  VITAL SIGNS:  Vitals:   12/26/22 1020  BP: (!) 159/91  Pulse: 66    Patient appears generally well normal body habitus no peripheral edema.  No musculoskeletal abnormalities the lungs are clear the heart exam reveals regular rate and rhythm with occasional extrasystoles no click murmur gallop or rub carotids are  normal JVP normal thyroid negative skin tone normal affect normal peripheral vascular exam normal no upper or lower extremity pulse deficits no abdominal pulsatile mass skin tone is normal  HB CHEEK MD FACC  Past Medical History,  Past Surgical History, Family History, Social History, Medications, Allergies, Social History: As reviewed in EPIC  Review of Systems: All positive and pertinent negatives are noted in the HPI; otherwise all other systems are negative   Objective Data Reviewed During this Patient Encounter:    EKG sinus rhythm normal  Recent echocardiogram: Normal LV function   @[id number] @  Care Everywhere  Lab Results  Component Value Date   CHOL 178 04/01/2020   TRIG 85 04/01/2020   HDL 54 04/01/2020   Lab Results  Component Value Date   WBC 6.60 11/21/2022   HGB 16.6 11/21/2022   HCT 47.7 11/21/2022   PLT 240 11/21/2022   Lab Results  Component Value Date   NA 138 11/21/2022   K 4.1 11/21/2022   CL 104 11/21/2022   CO2 24 11/21/2022   BUN 15 11/21/2022   CREATININE 1.56 (H) 11/21/2022   AST 14 11/21/2022   ALT 7 11/21/2022      Current Outpatient  Medications:  .  amLODIPine -benazepril  (LOTREL 5-20) 5-20 mg per capsule, Take 1 capsule by mouth daily., Disp: 90 capsule, Rfl: 1 .  brimonidine (Lumify) 0.025 % drop, Administer 1 drop into affected eye(s) daily., Disp: , Rfl:  .  metoprolol  succinate (TOPROL  XL) 25 mg 24 hr tablet, Take 1.5 tablets (37.5 mg total) by mouth daily. Take one tablet daily for control of high blood pressure and palpitations, Disp: 135 tablet, Rfl: 0
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
## 2023-04-14 DEATH — deceased

## 2023-12-19 ENCOUNTER — Other Ambulatory Visit: Payer: Self-pay

## 2023-12-19 ENCOUNTER — Observation Stay (HOSPITAL_BASED_OUTPATIENT_CLINIC_OR_DEPARTMENT_OTHER)
Admission: EM | Admit: 2023-12-19 | Discharge: 2023-12-20 | Disposition: A | Discharge status: Home / Self Care | Attending: Internal Medicine | Admitting: Internal Medicine

## 2023-12-19 ENCOUNTER — Encounter (HOSPITAL_BASED_OUTPATIENT_CLINIC_OR_DEPARTMENT_OTHER): Payer: Self-pay | Admitting: Emergency Medicine

## 2023-12-19 ENCOUNTER — Emergency Department (HOSPITAL_BASED_OUTPATIENT_CLINIC_OR_DEPARTMENT_OTHER): Admitting: Radiology

## 2023-12-19 ENCOUNTER — Emergency Department (HOSPITAL_BASED_OUTPATIENT_CLINIC_OR_DEPARTMENT_OTHER)

## 2023-12-19 DIAGNOSIS — N1832 Chronic kidney disease, stage 3b: Secondary | ICD-10-CM | POA: Diagnosis not present

## 2023-12-19 DIAGNOSIS — K5904 Chronic idiopathic constipation: Secondary | ICD-10-CM

## 2023-12-19 DIAGNOSIS — Z79899 Other long term (current) drug therapy: Secondary | ICD-10-CM | POA: Insufficient documentation

## 2023-12-19 DIAGNOSIS — I10 Essential (primary) hypertension: Secondary | ICD-10-CM | POA: Diagnosis not present

## 2023-12-19 DIAGNOSIS — Z9049 Acquired absence of other specified parts of digestive tract: Secondary | ICD-10-CM | POA: Insufficient documentation

## 2023-12-19 DIAGNOSIS — Z8679 Personal history of other diseases of the circulatory system: Secondary | ICD-10-CM | POA: Insufficient documentation

## 2023-12-19 DIAGNOSIS — E66811 Obesity, class 1: Secondary | ICD-10-CM | POA: Diagnosis not present

## 2023-12-19 DIAGNOSIS — R109 Unspecified abdominal pain: Secondary | ICD-10-CM | POA: Diagnosis present

## 2023-12-19 DIAGNOSIS — F1090 Alcohol use, unspecified, uncomplicated: Secondary | ICD-10-CM | POA: Insufficient documentation

## 2023-12-19 DIAGNOSIS — Z8709 Personal history of other diseases of the respiratory system: Secondary | ICD-10-CM | POA: Diagnosis not present

## 2023-12-19 DIAGNOSIS — R7989 Other specified abnormal findings of blood chemistry: Secondary | ICD-10-CM | POA: Diagnosis not present

## 2023-12-19 DIAGNOSIS — F17210 Nicotine dependence, cigarettes, uncomplicated: Secondary | ICD-10-CM | POA: Diagnosis not present

## 2023-12-19 DIAGNOSIS — I129 Hypertensive chronic kidney disease with stage 1 through stage 4 chronic kidney disease, or unspecified chronic kidney disease: Secondary | ICD-10-CM | POA: Diagnosis not present

## 2023-12-19 DIAGNOSIS — K5900 Constipation, unspecified: Secondary | ICD-10-CM | POA: Diagnosis not present

## 2023-12-19 DIAGNOSIS — J4520 Mild intermittent asthma, uncomplicated: Secondary | ICD-10-CM

## 2023-12-19 DIAGNOSIS — K56600 Partial intestinal obstruction, unspecified as to cause: Secondary | ICD-10-CM | POA: Diagnosis not present

## 2023-12-19 LAB — CBC
HCT: 44.3 % (ref 39.0–52.0)
Hemoglobin: 15.4 g/dL (ref 13.0–17.0)
MCH: 26.6 pg (ref 26.0–34.0)
MCHC: 34.8 g/dL (ref 30.0–36.0)
MCV: 76.4 fL — ABNORMAL LOW (ref 80.0–100.0)
Platelets: 223 K/uL (ref 150–400)
RBC: 5.8 MIL/uL (ref 4.22–5.81)
RDW: 12.5 % (ref 11.5–15.5)
WBC: 6.1 K/uL (ref 4.0–10.5)
nRBC: 0 % (ref 0.0–0.2)

## 2023-12-19 LAB — COMPREHENSIVE METABOLIC PANEL WITH GFR
ALT: 11 U/L (ref 0–44)
AST: 23 U/L (ref 15–41)
Albumin: 4.2 g/dL (ref 3.5–5.0)
Alkaline Phosphatase: 72 U/L (ref 38–126)
Anion gap: 12 (ref 5–15)
BUN: 17 mg/dL (ref 8–23)
CO2: 22 mmol/L (ref 22–32)
Calcium: 9.4 mg/dL (ref 8.9–10.3)
Chloride: 105 mmol/L (ref 98–111)
Creatinine, Ser: 1.68 mg/dL — ABNORMAL HIGH (ref 0.61–1.24)
GFR, Estimated: 43 mL/min — ABNORMAL LOW (ref >60)
Glucose, Bld: 96 mg/dL (ref 70–99)
Potassium: 4.5 mmol/L (ref 3.5–5.1)
Sodium: 139 mmol/L (ref 135–145)
Total Bilirubin: 0.3 mg/dL (ref 0.0–1.2)
Total Protein: 7.3 g/dL (ref 6.5–8.1)

## 2023-12-19 LAB — URINALYSIS, ROUTINE W REFLEX MICROSCOPIC
Bilirubin Urine: NEGATIVE
Glucose, UA: NEGATIVE mg/dL
Hgb urine dipstick: NEGATIVE
Ketones, ur: NEGATIVE mg/dL
Leukocytes,Ua: NEGATIVE
Nitrite: NEGATIVE
Protein, ur: NEGATIVE mg/dL
Specific Gravity, Urine: 1.01 (ref 1.005–1.030)
pH: 5.5 (ref 5.0–8.0)

## 2023-12-19 LAB — LACTIC ACID, PLASMA: Lactic Acid, Venous: 0.8 mmol/L (ref 0.5–1.9)

## 2023-12-19 LAB — TROPONIN T, HIGH SENSITIVITY
Troponin T High Sensitivity: 26 ng/L — ABNORMAL HIGH (ref <19)
Troponin T High Sensitivity: 22 ng/L — ABNORMAL HIGH (ref <19)

## 2023-12-19 LAB — LIPASE, BLOOD: Lipase: 44 U/L (ref 11–51)

## 2023-12-19 MED ORDER — DOCUSATE SODIUM 100 MG PO CAPS
100.0000 mg | ORAL_CAPSULE | Freq: Once | ORAL | Status: AC
  Administered 2023-12-19: 100 mg via ORAL
  Filled 2023-12-19: qty 1

## 2023-12-19 MED ORDER — SODIUM CHLORIDE 0.9 % IV SOLN: Freq: Once | INTRAVENOUS | Status: AC

## 2023-12-19 MED ORDER — SENNOSIDES-DOCUSATE SODIUM 8.6-50 MG PO TABS
1.0000 | ORAL_TABLET | Freq: Two times a day (BID) | ORAL | Status: DC
  Administered 2023-12-20: 1 via ORAL
  Filled 2023-12-19 (×2): qty 1

## 2023-12-19 MED ORDER — METOPROLOL TARTRATE 5 MG/5ML IV SOLN: 5.0000 mg | Freq: Three times a day (TID) | INTRAVENOUS | Status: DC

## 2023-12-19 MED ORDER — POLYETHYLENE GLYCOL 3350 17 G PO PACK
17.0000 g | PACK | Freq: Once | ORAL | Status: AC
  Administered 2023-12-19: 17 g via ORAL
  Filled 2023-12-19: qty 1

## 2023-12-19 MED ORDER — DEXTROSE 50 % IV SOLN: 50.0000 mL | INTRAVENOUS | Status: DC | PRN

## 2023-12-19 MED ORDER — AMLODIPINE BESYLATE 5 MG PO TABS: 5.0000 mg | ORAL_TABLET | Freq: Every day | ORAL | Status: DC

## 2023-12-19 MED ORDER — SODIUM CHLORIDE 0.9 % IV SOLN: 250.0000 mL | INTRAVENOUS | Status: DC | PRN

## 2023-12-19 MED ORDER — LACTATED RINGERS IV SOLN: INTRAVENOUS | Status: DC

## 2023-12-19 MED ORDER — SODIUM CHLORIDE 0.9% FLUSH: 3.0000 mL | INTRAVENOUS | Status: DC | PRN

## 2023-12-19 MED ORDER — HYDRALAZINE HCL 20 MG/ML IJ SOLN
10.0000 mg | Freq: Four times a day (QID) | INTRAMUSCULAR | Status: DC | PRN
  Administered 2023-12-20: 10 mg via INTRAVENOUS
  Filled 2023-12-19: qty 1

## 2023-12-19 MED ORDER — ACETAMINOPHEN 650 MG RE SUPP: 650.0000 mg | Freq: Four times a day (QID) | RECTAL | Status: DC | PRN

## 2023-12-19 MED ORDER — METOPROLOL SUCCINATE ER 25 MG PO TB24: 25.0000 mg | ORAL_TABLET | Freq: Every day | ORAL | Status: DC

## 2023-12-19 MED ORDER — POLYETHYLENE GLYCOL 3350 17 G PO PACK
17.0000 g | PACK | Freq: Two times a day (BID) | ORAL | Status: DC
  Filled 2023-12-19: qty 1

## 2023-12-19 MED ORDER — LACTULOSE 10 GM/15ML PO SOLN: 30.0000 g | Freq: Three times a day (TID) | ORAL | Status: DC

## 2023-12-19 MED ORDER — ONDANSETRON HCL 4 MG/2ML IJ SOLN
4.0000 mg | Freq: Four times a day (QID) | INTRAMUSCULAR | Status: DC | PRN
Start: 2023-12-19 — End: 2023-12-20

## 2023-12-19 MED ORDER — LACTULOSE 10 GM/15ML PO SOLN
30.0000 g | Freq: Three times a day (TID) | ORAL | Status: DC
  Administered 2023-12-20: 30 g via ORAL
  Filled 2023-12-19 (×2): qty 45

## 2023-12-19 MED ORDER — BENAZEPRIL HCL 20 MG PO TABS: 10.0000 mg | ORAL_TABLET | Freq: Every day | ORAL | Status: DC

## 2023-12-19 MED ORDER — SODIUM CHLORIDE 0.9% FLUSH
3.0000 mL | Freq: Two times a day (BID) | INTRAVENOUS | Status: DC
  Administered 2023-12-20 (×2): 3 mL via INTRAVENOUS

## 2023-12-19 MED ORDER — HEPARIN SODIUM (PORCINE) 5000 UNIT/ML IJ SOLN
5000.0000 [IU] | Freq: Three times a day (TID) | INTRAMUSCULAR | Status: DC
  Administered 2023-12-20: 5000 [IU] via SUBCUTANEOUS
  Filled 2023-12-19: qty 1

## 2023-12-19 MED ORDER — LACTULOSE 10 GM/15ML PO SOLN: 20.0000 g | Freq: Three times a day (TID) | ORAL | Status: DC

## 2023-12-19 MED ORDER — IOHEXOL 300 MG/ML  SOLN
85.0000 mL | Freq: Once | INTRAMUSCULAR | Status: AC | PRN
  Administered 2023-12-19: 85 mL via INTRAVENOUS

## 2023-12-19 MED ORDER — DOCUSATE SODIUM 100 MG PO CAPS: 100.0000 mg | ORAL_CAPSULE | Freq: Two times a day (BID) | ORAL | Status: DC

## 2023-12-19 MED ORDER — LEVALBUTEROL HCL 0.63 MG/3ML IN NEBU: 0.6300 mg | INHALATION_SOLUTION | Freq: Four times a day (QID) | RESPIRATORY_TRACT | Status: DC | PRN

## 2023-12-19 MED ORDER — ACETAMINOPHEN 325 MG PO TABS
650.0000 mg | ORAL_TABLET | Freq: Four times a day (QID) | ORAL | Status: DC | PRN
Start: 2023-12-19 — End: 2023-12-20

## 2023-12-19 MED ORDER — ONDANSETRON HCL 4 MG PO TABS: 4.0000 mg | ORAL_TABLET | Freq: Four times a day (QID) | ORAL | Status: DC | PRN

## 2023-12-19 NOTE — H&P (Signed)
 History and Physical    Kyle Vazquez FMW:[id number] DOB: September 28, 1950 DOA: 12/19/2023  PCP: Tanda Prentice DEL, MD   Patient coming from: Home   Chief Complaint:  Chief Complaint  Patient presents with   Abdominal Pain   ED TRIAGE note:Pt caox4 c/o intermittent LUQ abd pain, feeling light headed and nauseous since yesterday.  HPI:  Kyle Vazquez is a 73 y.o. male with medical history significant of essential hypertension, CKD stage IIIb, asthma, SVT and bigeminy with frequent PVC and colectomy 2023  presented emergency department complaining of abdominal pain and distention for last 24 hours.  Patient reported that he never feels nauseated, did not have any episode of vomiting.  At baseline patient has problem with constipation..  Last bowel movement was 3 to 4 days ago.  He is able to pass gas. In the ED after giving the MiraLAX  and docusate patient has 1 bowel movement already. Patient reported that he feeling distended left-sided abdomen with bloating sensation.  He has history of abdominal surgery 2 years ago secondary to abdominal polypectomy procedure patient developed intra abdominal bleeding which need exploratory laparotomy and patient underwent colectomy. No other previous abdominal surgery and history of hernia.  Patient did not have any episodes of a small bowel obstruction in the past. Patient denies any and vomiting.  Denies any abdominal pain and cramping.  His main concern is abdominal distention and bloating sensation.  Patient denies any chest pain, palpitation, shortness of breath and lower extremity swelling.  No other complaint at this time.  ED Course: At presentation to ED patient found borderline hypertensive otherwise hemodynamically stable. CBC unremarkable. CMP showing creatinine 1.68 and GFR 43.  Renal function at baseline.  Normal lipase level. UA unremarkable. Lactic acid within normal range. Flat troponin 26 and 22. EKG showed normal sinus rhythm  heart rate 71, there is no ST-T wave abnormality and normal QTc intervals. Chest x-ray active disease process.  CT abdomen pelvis showing: IMPRESSION: 1. Large stool burden suggesting constipation. A few fluid-filled left mid to upper quadrant small bowel loops, measuring up to 3 cm. This is just upstream of a short segment small bowel containing umbilical hernia. No adverse features at the hernia sac such as stranding or fluid, but partial or low-grade small bowel obstruction is not excluded. 2. 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 Diffusely thick walled urinary bladder, question cystitis versus chronic bladder outlet obstruction. 3. Cholelithiasis. 4. 5 mm left lower lobe pulmonary nodule. No follow-up needed if patient is low-risk.This recommendation follows the consensus statement: Guidelines for Management of Incidental Pulmonary Nodules Detected on CT Images: From the Fleischner Society 2017; Radiology 2017; 284:228-243. 5. Aortic atherosclerosis.  Basically CT abdomen pelvis unable to rule out any partial or low-grade small bowel obstruction.  ED physician has been consulted general surgery Dr. Polly who recommended bowel regimen MiraLAX  if patient tolerated.  Recommended to place NG tube if patient becomes symptomatic. Patient has been transferred to [facility] for further management.  General surgery will follow along.  In the ED patient has been given Colace, MiraLAX  and 1 L of NS bolus.  Hospitalist has been consulted for management of partial or low-grade small bowel obstruction and constipation.  Okay      Significant labs in the ED: Lab Orders         Lipase, blood         Comprehensive metabolic panel         CBC  Urinalysis, Routine w reflex microscopic -Urine, Clean Catch         Comprehensive metabolic panel         CBC       Review of Systems:  Review of Systems  Constitutional:  Negative for chills, fever,  malaise/fatigue and weight loss.  Respiratory:  Negative for cough, sputum production and shortness of breath.   Cardiovascular:  Negative for chest pain and palpitations.  Gastrointestinal:  Positive for constipation. Negative for abdominal pain, blood in stool, diarrhea, heartburn, melena, nausea and vomiting.       Abdominal distention and bloating  Genitourinary:  Negative for dysuria and flank pain.  Neurological:  Negative for dizziness and headaches.  Psychiatric/Behavioral:  The patient is not nervous/anxious.     Past Medical History:  Diagnosis Date   Asthma    Hypertension     Past Surgical History:  Procedure Laterality Date   ACHILLES TENDON SURGERY     COLON RESECTION     INCISION AND DRAINAGE HIP Right 04/07/2015   Procedure: EVACUATION HEMATOMA RIGHT HIP/THIGH;  Surgeon: Lonni CINDERELLA Poli, MD;  Location: [facility] OR;  Service: Orthopedics;  Laterality: Right;   TONSILLECTOMY       reports that he has been smoking cigars. He does not have any smokeless tobacco history on file. He reports current alcohol use. He reports that he does not use drugs.  No Known Allergies  History reviewed. No pertinent family history.  Prior to Admission medications   Medication Sig Start Date End Date Taking? Authorizing Provider  amLODipine -benazepril  (LOTREL) 5-20 MG capsule Take one capsule daily for blood pressure control 03/28/23  Yes [provider]  metoprolol  succinate (TOPROL -XL) 25 MG 24 hr tablet Take one and a half tablets daily for heart rate control 03/28/23  Yes [provider]  acetaminophen  (TYLENOL ) 325 MG tablet Take 2 tablets (650 mg total) by mouth every 4 (four) hours as needed for mild pain. 04/09/15   Baird, Megan N, PA-C  amLODipine -benazepril  (LOTREL) 5-10 MG capsule Take 1 capsule by mouth daily with breakfast. 02/16/15   [provider]  COVID-19 mRNA bivalent vaccine, Pfizer, (PFIZER COVID-19 VAC BIVALENT) injection Inject into the  muscle. 04/22/21   Luiz Channel, MD  COVID-19 mRNA Vac-TriS, Pfizer, SUSP injection Inject into the muscle. 12/16/20   Luiz Channel, MD  docusate sodium  (COLACE) 100 MG capsule Take 1 capsule (100 mg total) by mouth 2 (two) times daily as needed for mild constipation. 04/09/15   Baird, Megan N, PA-C  ferrous sulfate  (FERROUSUL) 325 (65 FE) MG tablet Take 1 tablet (325 mg total) by mouth daily with breakfast. 04/09/15   Baird, Megan N, PA-C  polyethylene glycol (MIRALAX  / GLYCOLAX ) packet Take 17 g by mouth daily. 04/09/15   Baird, Megan N, PA-C  Polyvinyl Alcohol-Povidone (REFRESH OP) Place 1 drop into both eyes daily as needed (dry eyes /itching).    [provider]     Physical Exam: Vitals:   12/19/23 2115 12/19/23 2130 12/19/23 2200 12/19/23 2303  BP: (!) 169/98 (!) 166/97 (!) 151/90 (!) 151/98  Pulse: 65 65 66 69  Resp: 18 15 18 18   Temp:    98.1 F (36.7 C)  SpO2: 97% 98% 97% 100%    Physical Exam Vitals and nursing note reviewed.  Constitutional:      Appearance: He is obese. He is not ill-appearing.  Cardiovascular:     Rate and Rhythm: Normal rate and regular rhythm.  Abdominal:  General: Bowel sounds are decreased. There is distension.     Palpations: Abdomen is soft. There is no fluid wave.     Tenderness: There is no abdominal tenderness. There is no guarding or rebound.     Hernia: There is no hernia in the umbilical area or ventral area.     Comments: Midabdominal scar tissue  Skin:    General: Skin is warm.     Capillary Refill: Capillary refill takes less than 2 seconds.  Neurological:     Mental Status: He is alert and oriented to person, place, and time.      Labs on Admission: I have personally reviewed following labs and imaging studies  CBC: Recent Labs  Lab 12/19/23 1855  WBC 6.1  HGB 15.4  HCT 44.3  MCV 76.4*  PLT 223   Basic Metabolic Panel: Recent Labs  Lab 12/19/23 1855  NA 139  K 4.5  CL 105  CO2 22  GLUCOSE 96   BUN 17  CREATININE 1.68*  CALCIUM 9.4   GFR: CrCl cannot be calculated (Unknown ideal weight.). Liver Function Tests: Recent Labs  Lab 12/19/23 1855  AST 23  ALT 11  ALKPHOS 72  BILITOT 0.3  PROT 7.3  ALBUMIN 4.2   Recent Labs  Lab 12/19/23 1855  LIPASE 44   No results for input(s): AMMONIA in the last 168 hours. Coagulation Profile: No results for input(s): INR, PROTIME in the last 168 hours. Cardiac Enzymes: No results for input(s): CKTOTAL, CKMB, CKMBINDEX, TROPONINI, TROPONINIHS in the last 168 hours. BNP (last 3 results) No results for input(s): BNP in the last 8760 hours. HbA1C: No results for input(s): HGBA1C in the last 72 hours. CBG: No results for input(s): GLUCAP in the last 168 hours. Lipid Profile: No results for input(s): CHOL, HDL, LDLCALC, TRIG, CHOLHDL, LDLDIRECT in the last 72 hours. Thyroid Function Tests: No results for input(s): TSH, T4TOTAL, FREET4, T3FREE, THYROIDAB in the last 72 hours. Anemia Panel: No results for input(s): VITAMINB12, FOLATE, FERRITIN, TIBC, IRON, RETICCTPCT in the last 72 hours. Urine analysis:    Component Value Date/Time   COLORURINE COLORLESS (A) 12/19/2023 1855   APPEARANCEUR CLEAR 12/19/2023 1855   LABSPEC 1.010 12/19/2023 1855   PHURINE 5.5 12/19/2023 1855   GLUCOSEU NEGATIVE 12/19/2023 1855   HGBUR NEGATIVE 12/19/2023 1855   BILIRUBINUR NEGATIVE 12/19/2023 1855   KETONESUR NEGATIVE 12/19/2023 1855   PROTEINUR NEGATIVE 12/19/2023 1855   NITRITE NEGATIVE 12/19/2023 1855   LEUKOCYTESUR NEGATIVE 12/19/2023 1855    Radiological Exams on Admission: I have personally reviewed images CT ABDOMEN PELVIS W CONTRAST Result Date: 12/19/2023 CLINICAL DATA:  Left upper quadrant pain nausea EXAM: CT ABDOMEN AND PELVIS WITH CONTRAST TECHNIQUE: Multidetector CT imaging of the abdomen and pelvis was performed using the standard protocol following bolus administration of  intravenous contrast. RADIATION DOSE REDUCTION: This exam was performed according to the departmental dose-optimization program which includes automated exposure control, adjustment of the mA and/or kV according to patient size and/or use of iterative reconstruction technique. CONTRAST:  85mL OMNIPAQUE  IOHEXOL  300 MG/ML  SOLN COMPARISON:  CT 04/05/2015 FINDINGS: Lower chest: Lung bases demonstrate no acute airspace disease. Micro nodularity at the bases, likely post inflammatory or post infectious. 5 mm left lower lobe pulmonary nodule on series 3, image 3. Hepatobiliary: Small calcified gallstones. Left hepatic lobe cyst. Subcentimeter hypodensity in the right hepatic lobe too small to further characterize. No biliary dilatation Pancreas: Unremarkable. No pancreatic ductal dilatation or surrounding inflammatory changes. Spleen: Normal  in size without focal abnormality. Adrenals/Urinary Tract: Adrenal glands are normal. Kidneys show no hydronephrosis. Small cyst in the left kidney, no imaging follow-up recommended. Urinary bladder is diffusely thick walled. Stomach/Bowel: Stomach nonenlarged. Large stool burden suggesting constipation. Postsurgical changes at the ileocecal region. Some fluid-filled left mid to upper quadrant small bowel, measuring up to 3 cm. This is just upstream of a short segment of bowel within a small umbilical hernia. Otherwise no bowel distension is seen. Vascular/Lymphatic: Aortic atherosclerosis. No enlarged abdominal or pelvic lymph nodes. 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 Other: Negative for pelvic effusion or free air. Musculoskeletal: Multilevel degenerative changes. No acute osseous abnormality IMPRESSION: 1. Large stool burden suggesting constipation. A few fluid-filled left mid to upper quadrant small bowel loops, measuring up to 3 cm. This is just upstream of a short segment small bowel containing umbilical hernia. No adverse features at the hernia sac  such as stranding or fluid, but partial or low-grade small bowel obstruction is not excluded. 2. 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 Diffusely thick walled urinary bladder, question cystitis versus chronic bladder outlet obstruction. 3. Cholelithiasis. 4. 5 mm left lower lobe pulmonary nodule. No follow-up needed if patient is low-risk.This recommendation follows the consensus statement: Guidelines for Management of Incidental Pulmonary Nodules Detected on CT Images: From the Fleischner Society 2017; Radiology 2017; 284:228-243. 5. Aortic atherosclerosis. Aortic Atherosclerosis (ICD10-I70.0). Electronically Signed   By: Luke Bun M.D.   On: 12/19/2023 20:09   DG Chest 2 View Result Date: 12/19/2023 CLINICAL DATA:  Pain. Left upper quadrant abdominal pain. Lightheaded and nauseous. EXAM: CHEST - 2 VIEW COMPARISON:  09/21/2006 FINDINGS: Normal heart size and pulmonary vascularity. No focal airspace disease or consolidation in the lungs. No blunting of costophrenic angles. No pneumothorax. Mediastinal contours appear intact. Degenerative changes in the spine and shoulders. IMPRESSION: No active cardiopulmonary disease. Electronically Signed   By: Elsie Gravely M.D.   On: 12/19/2023 20:08     EKG: My personal interpretation of EKG shows:     Assessment/Plan: Principal Problem:   Partial small bowel obstruction (HCC) Active Problems:   Constipation   Essential hypertension   CKD stage 3b, GFR 30-44 ml/min (HCC)   History of asthma   History of supraventricular tachycardia    Assessment and Plan: Constipation Low-grade or partial small bowel obstruction History of colectomy 2023 -Presented to emergency department complaining of abdominal distention and bloating sensation for last 24 hours.  Denies any abdominal pain, nausea and vomiting.  History of colectomy 2023. -Physical exam reveals distended and nontender abdomen.  Soft abdomen on palpation.  No guarding and rigidity.   No acute abdominal sign. -At presentation to ED patient is hemodynamically stable.  CBC and CMP grossly unremarkable.  Normal lactic acid level.  Renal function at baseline.  UA unremarkable.  Normal lipase level. - CT abdomen pelvis showed large stool burden. A few fluid-filled left mid to upper quadrant small bowel loops, measuring up to 3 cm. This is just upstream of a short segment small bowel containing umbilical hernia. No adverse features at the hernia sac such as stranding or fluid, but partial or low-grade small bowel obstruction is not excluded. -ED physician discussed case with general surgery Dr. Polly, per general surgery at this point there is no need for NG tube placement given patient has mostly constipation recommended bowel regimen to relieve constipation.  However if patient develops any abdominal pain, nausea and acute abdominal sign in that case  need NG tube placement.  Discussed case with Dr. Metzgar overnight.  Given patient is currently asymptomatic without any acute abdominal sign, able to pass flatus and having 1 episode of bowel movement at this time there is no concern for SBO. - After receiving MiraLAX  and docusate patient already small amount of bowel movement in the ED. - Plan to continue aggressive bowel regimen include MiraLAX  twice daily, Senokot twice daily and lactulose  30 g 3 times daily. -Keeping patient n.p.o. overnight for bowel rest. - Starting maintenance fluid LR 125 cc/h. -If patient develops any abdominal pain/nausea/vomiting need to inform general surgery Dr. Aron who is covering [facility].    Elevated troponin -Elevated troponin 26 and 22.  Patient denies any chest pain. KG showed normal sinus rhythm, heart rate 71, there is no ST anterior abnormality.  Normal QTc interval. -Patient denies any chest pain. - Elevated troponin in the setting of demand ischemia in the context of constipation vs early onset of partial small bowel  obstruction/low-grade obstruction. -At this time there is no concern for ACS.     Essential hypertension -Continue amlodipine  and benazepril .  CKD stage IIIb -Creatinine 1.6 and GFR 43.  Renal function at baseline.  Avoid nephrotoxic agent.  Monitor urine output.  Renally adjust medications.  History of asthma -Continue Xopenex  as needed for wheezing shortness of breath.  History of SVT EKG showed normal sinus rhythm - Continue Toprol -XL 25 mg daily.    DVT prophylaxis:  SQ Heparin  Code Status:  Full Code Diet: N.p.o. for bowel rest. Family Communication:   Family was present at bedside, at the time of interview. Opportunity was given to ask question and all questions were answered satisfactorily.  Disposition Plan: Continue to monitor improvement of bowel movement. Consults: General surgery Admission status:   Observation, Telemetry bed  Severity of Illness: The appropriate patient status for this patient is OBSERVATION. Observation status is judged to be reasonable and necessary in order to provide the required intensity of service to ensure the patient's safety. The patient's presenting symptoms, physical exam findings, and initial radiographic and laboratory data in the context of their medical condition is felt to place them at decreased risk for further clinical deterioration. Furthermore, it is anticipated that the patient will be medically stable for discharge from the hospital within 2 midnights of admission.     Bliss Tsang, MD [facility]  How to contact the [facility] Attending or Consulting provider 7A - 7P or covering provider during after hours 7P -7A, for this patient.  Check the care team in [facility] and look for a) attending/consulting [facility] provider listed and b) the [facility] team listed Log into [email or web address] and use [facility] universal password to access. If you do not have the password, please contact the hospital operator. Locate the [facility] provider you are  looking for under [facility] and page to a number that you can be directly reached. If you still have difficulty reaching the provider, please page the [facility] (Director on Call) for the Hospitalists listed on amion for assistance.  12/19/2023, 11:42 PM

## 2023-12-19 NOTE — Plan of Care (Signed)
 Plan of Care Note for accepted transfer   Patient name: FELTON BUCZYNSKI FMW:[id number] DOB: [birth year]  Facility requesting transfer: [facility] ED Requesting Provider: Dr. Ruthe Facility course: 73 year old male with history of hypertension, CKD stage IIIa, asthma, SVT, bigeminy, frequent PVCs presented with complaints of abdominal pain/distention, lightheadedness, and nausea since yesterday.  No fever or leukocytosis, lactate in process, troponin 26> 22.  CT abdomen pelvis showing large stool burden/constipation and concerning for possible partial or low-grade SBO.  Chest x-ray showing no active cardiopulmonary disease.  EDP discussed the case with general surgeon Dr. Polly who recommended bowel regimen and admitting to hospitalist service at [facility].  General surgery will consult.  Patient was given Colace, MiraLAX , and IV fluids.  Plan of care: The patient is accepted for admission to Telemetry unit at [facility].  [facility] will assume care on arrival to accepting facility. Until arrival, care as per EDP. However, [facility] available 24/7 for questions and assistance.  Check [email or web address] for on-call coverage.  Nursing staff, please call [facility] Admits & Consults System-Wide number under Amion on patient's arrival so appropriate admitting provider can evaluate the pt.

## 2023-12-19 NOTE — ED Provider Notes (Signed)
 [facility] AT [facility] Provider Note   CSN: [id number] Arrival date & time: 12/19/23  1839     Patient presents with: Abdominal Pain   Kyle Vazquez is a 73 y.o. male.   Patient here with abdominal distention and bloating, left upper abdominal pain.  Patient with history of hypertension asthma.  History of colon resection in the past.  Has felt nauseous lightheaded.  Has not had any bowel movement or passing of gas since being here.  Denies any weakness numbness tingling.  Denies any headache.  Denies any recent illness otherwise.  Denies any fever.  Nothing makes it worse or better.  The history is provided by the patient.       Prior to Admission medications   Medication Sig Start Date End Date Taking? Authorizing Provider  amLODipine -benazepril  (LOTREL) 5-20 MG capsule Take one capsule daily for blood pressure control 03/28/23  Yes [provider]  metoprolol  succinate (TOPROL -XL) 25 MG 24 hr tablet Take one and a half tablets daily for heart rate control 03/28/23  Yes [provider]  acetaminophen  (TYLENOL ) 325 MG tablet Take 2 tablets (650 mg total) by mouth every 4 (four) hours as needed for mild pain. 04/09/15   Baird, Megan N, PA-C  amLODipine -benazepril  (LOTREL) 5-10 MG capsule Take 1 capsule by mouth daily with breakfast. 02/16/15   [provider]  COVID-19 mRNA bivalent vaccine, Pfizer, (PFIZER COVID-19 VAC BIVALENT) injection Inject into the muscle. 04/22/21   Luiz Channel, MD  COVID-19 mRNA Vac-TriS, Pfizer, SUSP injection Inject into the muscle. 12/16/20   Luiz Channel, MD  docusate sodium  (COLACE) 100 MG capsule Take 1 capsule (100 mg total) by mouth 2 (two) times daily as needed for mild constipation. 04/09/15   Baird, Megan N, PA-C  ferrous sulfate  (FERROUSUL) 325 (65 FE) MG tablet Take 1 tablet (325 mg total) by mouth daily with breakfast. 04/09/15   Baird, Megan N, PA-C  polyethylene glycol (MIRALAX  /  GLYCOLAX ) packet Take 17 g by mouth daily. 04/09/15   Baird, Megan N, PA-C  Polyvinyl Alcohol-Povidone (REFRESH OP) Place 1 drop into both eyes daily as needed (dry eyes /itching).    [provider]    Allergies: Patient has no known allergies.    Review of Systems  Updated Vital Signs BP (!) 166/97   Pulse 65   Temp 98.1 F (36.7 C)   Resp 15   SpO2 98%   Physical Exam Vitals and nursing note reviewed.  Constitutional:      General: He is not in acute distress.    Appearance: He is well-developed. He is not ill-appearing.  HENT:     Head: Normocephalic and atraumatic.     Mouth/Throat:     Mouth: Mucous membranes are moist.  Eyes:     Extraocular Movements: Extraocular movements intact.     Conjunctiva/sclera: Conjunctivae normal.     Pupils: Pupils are equal, round, and reactive to light.  Cardiovascular:     Rate and Rhythm: Normal rate and regular rhythm.     Heart sounds: Normal heart sounds. No murmur heard. Pulmonary:     Effort: Pulmonary effort is normal. No respiratory distress.     Breath sounds: Normal breath sounds.  Abdominal:     General: Abdomen is flat. There is distension.     Palpations: Abdomen is soft.     Tenderness: There is abdominal tenderness in the left upper quadrant and left lower quadrant.  Musculoskeletal:  General: No swelling.     Cervical back: Neck supple.  Skin:    General: Skin is warm and dry.     Capillary Refill: Capillary refill takes less than 2 seconds.  Neurological:     Mental Status: He is alert.  Psychiatric:        Mood and Affect: Mood normal.     (all labs ordered are listed, but only abnormal results are displayed) Labs Reviewed  COMPREHENSIVE METABOLIC PANEL WITH GFR - Abnormal; Notable for the following components:      Result Value   Creatinine, Ser 1.68 (*)    GFR, Estimated 43 (*)    All other components within normal limits  CBC - Abnormal; Notable for the following components:   MCV  76.4 (*)    All other components within normal limits  URINALYSIS, ROUTINE W REFLEX MICROSCOPIC - Abnormal; Notable for the following components:   Color, Urine COLORLESS (*)    All other components within normal limits  TROPONIN T, HIGH SENSITIVITY - Abnormal; Notable for the following components:   Troponin T High Sensitivity 26 (*)    All other components within normal limits  TROPONIN T, HIGH SENSITIVITY - Abnormal; Notable for the following components:   Troponin T High Sensitivity 22 (*)    All other components within normal limits  LIPASE, BLOOD  LACTIC ACID, PLASMA  LACTIC ACID, PLASMA    EKG: EKG Interpretation Date/Time:  Tuesday December 19 2023 18:50:06 EDT Ventricular Rate:  71 PR Interval:  178 QRS Duration:  86 QT Interval:  384 QTC Calculation: 417 R Axis:   76  Text Interpretation: Normal sinus rhythm Normal ECG When compared with ECG of 05-Apr-2015 18:55, PREVIOUS ECG IS PRESENT Confirmed by Ruthe Cornet [phone]) on 12/19/2023 6:53:47 PM  Radiology: CT ABDOMEN PELVIS W CONTRAST Result Date: 12/19/2023 CLINICAL DATA:  Left upper quadrant pain nausea EXAM: CT ABDOMEN AND PELVIS WITH CONTRAST TECHNIQUE: Multidetector CT imaging of the abdomen and pelvis was performed using the standard protocol following bolus administration of intravenous contrast. RADIATION DOSE REDUCTION: This exam was performed according to the departmental dose-optimization program which includes automated exposure control, adjustment of the mA and/or kV according to patient size and/or use of iterative reconstruction technique. CONTRAST:  85mL OMNIPAQUE  IOHEXOL  300 MG/ML  SOLN COMPARISON:  CT 04/05/2015 FINDINGS: Lower chest: Lung bases demonstrate no acute airspace disease. Micro nodularity at the bases, likely post inflammatory or post infectious. 5 mm left lower lobe pulmonary nodule on series 3, image 3. Hepatobiliary: Small calcified gallstones. Left hepatic lobe cyst. Subcentimeter hypodensity in  the right hepatic lobe too small to further characterize. No biliary dilatation Pancreas: Unremarkable. No pancreatic ductal dilatation or surrounding inflammatory changes. Spleen: Normal in size without focal abnormality. Adrenals/Urinary Tract: Adrenal glands are normal. Kidneys show no hydronephrosis. Small cyst in the left kidney, no imaging follow-up recommended. Urinary bladder is diffusely thick walled. Stomach/Bowel: Stomach nonenlarged. Large stool burden suggesting constipation. Postsurgical changes at the ileocecal region. Some fluid-filled left mid to upper quadrant small bowel, measuring up to 3 cm. This is just upstream of a short segment of bowel within a small umbilical hernia. Otherwise no bowel distension is seen. Vascular/Lymphatic: Aortic atherosclerosis. No enlarged abdominal or pelvic lymph nodes. 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 Other: Negative for pelvic effusion or free air. Musculoskeletal: Multilevel degenerative changes. No acute osseous abnormality IMPRESSION: 1. Large stool burden suggesting constipation. A few fluid-filled left mid to upper quadrant small bowel  loops, measuring up to 3 cm. This is just upstream of a short segment small bowel containing umbilical hernia. No adverse features at the hernia sac such as stranding or fluid, but partial or low-grade small bowel obstruction is not excluded. 2. 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 Diffusely thick walled urinary bladder, question cystitis versus chronic bladder outlet obstruction. 3. Cholelithiasis. 4. 5 mm left lower lobe pulmonary nodule. No follow-up needed if patient is low-risk.This recommendation follows the consensus statement: Guidelines for Management of Incidental Pulmonary Nodules Detected on CT Images: From the Fleischner Society 2017; Radiology 2017; 284:228-243. 5. Aortic atherosclerosis. Aortic Atherosclerosis (ICD10-I70.0). Electronically Signed   By: Luke Bun  M.D.   On: 12/19/2023 20:09   DG Chest 2 View Result Date: 12/19/2023 CLINICAL DATA:  Pain. Left upper quadrant abdominal pain. Lightheaded and nauseous. EXAM: CHEST - 2 VIEW COMPARISON:  09/21/2006 FINDINGS: Normal heart size and pulmonary vascularity. No focal airspace disease or consolidation in the lungs. No blunting of costophrenic angles. No pneumothorax. Mediastinal contours appear intact. Degenerative changes in the spine and shoulders. IMPRESSION: No active cardiopulmonary disease. Electronically Signed   By: Elsie Gravely M.D.   On: 12/19/2023 20:08     Procedures   Medications Ordered in the ED  iohexol  (OMNIPAQUE ) 300 MG/ML solution 85 mL (85 mLs Intravenous Contrast Given 12/19/23 1948)  0.9 %  sodium chloride  infusion ( Intravenous New Bag/Given 12/19/23 2117)  polyethylene glycol (MIRALAX  / GLYCOLAX ) packet 17 g (17 g Oral Given 12/19/23 2109)  docusate sodium  (COLACE) capsule 100 mg (100 mg Oral Given 12/19/23 2109)                                    Medical Decision Making Amount and/or Complexity of Data Reviewed Labs: ordered. Radiology: ordered.  Risk OTC drugs. Prescription drug management. Decision regarding hospitalization.   REHAN HOLNESS is here with left upper abdominal pain distention.  History of hypertension asthma.  Differential diagnosis could be bowel obstruction versus less likely cardiac process versus UTI versus kidney stone.  Will get CBC CMP lipase urinalysis CT scan abdomen pelvis troponin EKG.  EKG shows sinus rhythm.  No ischemic changes.  Troponin 26 and 22.  Mild CKD on labs as well.  Overall I do not think there is a cardiac process.  Gallbladder and liver enzymes unremarkable.  No significant leukocytosis or anemia per my review interpretation of labs.  CT scan showed large stool burden versus also dilated bowel loops in the small bowel.  May be umbilical hernia upstream of this.  But no adverse features to the hernia sac.  I do not see  anything on exam that I could reduce.  Ultimately I do think he could be developing a low-grade small bowel obstruction/partial bowel obstruction.  Clinically this fits.  But he is not actively throwing up or nauseous.  This could be constipation related as well.  I talked with Dr. Polly with general surgery who recommended bowel regimen.  Will give MiraLAX  see if he can tolerate that.  Will place an NG tube if he becomes symptomatic.  Will admit him to hospitalist with Dr. Alfornia for further symptomatic care.  General surgery to follow along.  This chart was dictated using voice recognition software.  Despite best efforts to proofread,  errors can occur which can change the documentation meaning.      Final diagnoses:  Constipation,  unspecified constipation type  Partial small bowel obstruction [facility])    ED Discharge Orders     None          Ruthe Cornet, DO 12/19/23 2211

## 2023-12-19 NOTE — ED Triage Notes (Signed)
 Pt caox4 c/o intermittent LUQ abd pain, feeling light headed and nauseous since yesterday

## 2023-12-19 NOTE — ED Notes (Signed)
 Pt ambulated to and from bathroom with steady gait. Placed back on monitor. Awaiting rpt lab results. Pt to be admitted for observation.

## 2023-12-20 ENCOUNTER — Encounter (HOSPITAL_COMMUNITY): Payer: Self-pay | Admitting: General Surgery

## 2023-12-20 DIAGNOSIS — K56600 Partial intestinal obstruction, unspecified as to cause: Secondary | ICD-10-CM | POA: Diagnosis not present

## 2023-12-20 LAB — CBC
HCT: 43.5 % (ref 39.0–52.0)
Hemoglobin: 14.8 g/dL (ref 13.0–17.0)
MCH: 26.7 pg (ref 26.0–34.0)
MCHC: 34 g/dL (ref 30.0–36.0)
MCV: 78.5 fL — ABNORMAL LOW (ref 80.0–100.0)
Platelets: 206 K/uL (ref 150–400)
RBC: 5.54 MIL/uL (ref 4.22–5.81)
RDW: 12.7 % (ref 11.5–15.5)
WBC: 7.2 K/uL (ref 4.0–10.5)
nRBC: 0 % (ref 0.0–0.2)

## 2023-12-20 LAB — COMPREHENSIVE METABOLIC PANEL WITH GFR
ALT: 11 U/L (ref 0–44)
AST: 16 U/L (ref 15–41)
Albumin: 3.6 g/dL (ref 3.5–5.0)
Alkaline Phosphatase: 53 U/L (ref 38–126)
Anion gap: 7 (ref 5–15)
BUN: 14 mg/dL (ref 8–23)
CO2: 23 mmol/L (ref 22–32)
Calcium: 8.9 mg/dL (ref 8.9–10.3)
Chloride: 108 mmol/L (ref 98–111)
Creatinine, Ser: 1.52 mg/dL — ABNORMAL HIGH (ref 0.61–1.24)
GFR, Estimated: 49 mL/min — ABNORMAL LOW (ref >60)
Glucose, Bld: 92 mg/dL (ref 70–99)
Potassium: 4.1 mmol/L (ref 3.5–5.1)
Sodium: 138 mmol/L (ref 135–145)
Total Bilirubin: 1 mg/dL (ref 0.0–1.2)
Total Protein: 6.3 g/dL — ABNORMAL LOW (ref 6.5–8.1)

## 2023-12-20 MED ORDER — SENNOSIDES-DOCUSATE SODIUM 8.6-50 MG PO TABS: 1.0000 | ORAL_TABLET | Freq: Two times a day (BID) | ORAL | 0 refills | Status: AC

## 2023-12-20 MED ORDER — POLYETHYLENE GLYCOL 3350 17 G PO PACK: 17.0000 g | PACK | Freq: Two times a day (BID) | ORAL | Status: AC

## 2023-12-20 MED ORDER — BENAZEPRIL HCL 20 MG PO TABS: 20.0000 mg | ORAL_TABLET | Freq: Every day | ORAL | Status: DC

## 2023-12-20 MED ORDER — LACTULOSE 10 GM/15ML PO SOLN
10.0000 g | Freq: Two times a day (BID) | ORAL | 1 refills | Status: AC
Start: 2023-12-20 — End: ?

## 2023-12-20 MED ORDER — METOPROLOL SUCCINATE ER 25 MG PO TB24
37.5000 mg | ORAL_TABLET | Freq: Every day | ORAL | Status: DC
  Administered 2023-12-20: 37.5 mg via ORAL
  Filled 2023-12-20: qty 2

## 2023-12-20 MED ORDER — SMOG ENEMA
400.0000 mL | Freq: Once | RECTAL | Status: AC
  Administered 2023-12-20: 400 mL via RECTAL
  Filled 2023-12-20: qty 960

## 2023-12-20 MED ORDER — AMLODIPINE BESYLATE 5 MG PO TABS
5.0000 mg | ORAL_TABLET | Freq: Every day | ORAL | Status: DC
  Administered 2023-12-20: 5 mg via ORAL
  Filled 2023-12-20: qty 1

## 2023-12-20 NOTE — Consult Note (Signed)
 Kyle Vazquez 23-Dec-1950  [id number].    Requesting MD: [name] Chief Complaint/Reason for Consult: constipation, umbilical hernia  HPI:  This is a 73 yo black male with a history of HTN, CKD, and constipation.  He used to be on miralax  but stopped secondary to moving his bowels well.  Over the last several days he has noted abdominal distention and his abdomen feeling hard.  He has been able to continue to pass flatus.   He has felt a bit light-headed though as well.  He presented to the [facility] ED for evaluation.  His labs were overall unremarkable.  He underwent a CT scan that revealed significant constipation as well as a small umbilical hernia that contained nondilated loop of small bowel.  Overnight he has been given miralax  and other promotility agents.  He has had a couple of BMs with pellet type results, but passing a lot of flatus and feeling much better.  We have been asked to see him due to his CT scan findings.  ROS: ROS: see HPI  History reviewed. No pertinent family history.  Past Medical History:  Diagnosis Date   Asthma    Hypertension     Past Surgical History:  Procedure Laterality Date   ACHILLES TENDON SURGERY     COLON RESECTION     INCISION AND DRAINAGE HIP Right 04/07/2015   Procedure: EVACUATION HEMATOMA RIGHT HIP/THIGH;  Surgeon: Lonni CINDERELLA Poli, MD;  Location: [facility] OR;  Service: Orthopedics;  Laterality: Right;   TONSILLECTOMY      Social History:  reports that he has been smoking cigars. He does not have any smokeless tobacco history on file. He reports current alcohol use. He reports that he does not use drugs.  Allergies: No Known Allergies  Medications Prior to Admission  Medication Sig Dispense Refill   amLODipine -benazepril  (LOTREL) 5-20 MG capsule Take one capsule daily for blood pressure control     metoprolol  succinate (TOPROL -XL) 25 MG 24 hr tablet Take one and a half tablets daily for heart rate control      acetaminophen  (TYLENOL ) 325 MG tablet Take 2 tablets (650 mg total) by mouth every 4 (four) hours as needed for mild pain.     amLODipine -benazepril  (LOTREL) 5-10 MG capsule Take 1 capsule by mouth daily with breakfast.  3   COVID-19 mRNA bivalent vaccine, Pfizer, (PFIZER COVID-19 VAC BIVALENT) injection Inject into the muscle. 0.3 mL 0   COVID-19 mRNA Vac-TriS, Pfizer, SUSP injection Inject into the muscle. 0.3 mL 0   docusate sodium  (COLACE) 100 MG capsule Take 1 capsule (100 mg total) by mouth 2 (two) times daily as needed for mild constipation. 10 capsule 0   ferrous sulfate  (FERROUSUL) 325 (65 FE) MG tablet Take 1 tablet (325 mg total) by mouth daily with breakfast. 60 tablet 0   polyethylene glycol (MIRALAX  / GLYCOLAX ) packet Take 17 g by mouth daily. 14 each 0   Polyvinyl Alcohol-Povidone (REFRESH OP) Place 1 drop into both eyes daily as needed (dry eyes /itching).       Physical Exam: Blood pressure (!) 146/85, pulse 66, temperature 98.4 F (36.9 C), resp. rate 18, height 5' 10 (1.778 m), weight 104.6 kg, SpO2 98%. General: pleasant, WD, WN black male who is sitting up in NAD HEENT: head is normocephalic, atraumatic.  Sclera are noninjected.  PERRL.  Ears and nose without any masses or lesions.  Mouth is pink and moist Lungs:  Respiratory effort nonlabored Abd: soft, NT,  ND, +BS, no masses or organomegaly.  He has an incredibly tiny umbilical defect that may be about a half centimeter in size.  This was empty and no incarceration noted.  This is difficult to find/feel because it is immediately at the center/base of his umbilicus and is very small. MS: all 4 extremities are symmetrical with no cyanosis, clubbing, or edema. Psych: A&Ox3 with an appropriate affect.   Results for orders placed or performed during the hospital encounter of 12/19/23 (from the past 48 hours)  Lipase, blood     Status: None   Collection Time: 12/19/23  6:55 PM  Result Value Ref Range   Lipase 44 11 - 51  U/L    Comment: Performed at [facility], [address] [place], [place] [address]  Comprehensive metabolic panel     Status: Abnormal   Collection Time: 12/19/23  6:55 PM  Result Value Ref Range   Sodium 139 135 - 145 mmol/L   Potassium 4.5 3.5 - 5.1 mmol/L   Chloride 105 98 - 111 mmol/L   CO2 22 22 - 32 mmol/L   Glucose, Bld 96 70 - 99 mg/dL    Comment: Glucose reference range applies only to samples taken after fasting for at least 8 hours.   BUN 17 8 - 23 mg/dL   Creatinine, Ser 8.31 (H) 0.61 - 1.24 mg/dL   Calcium 9.4 8.9 - 89.6 mg/dL   Total Protein 7.3 6.5 - 8.1 g/dL   Albumin 4.2 3.5 - 5.0 g/dL   AST 23 15 - 41 U/L   ALT 11 0 - 44 U/L   Alkaline Phosphatase 72 38 - 126 U/L   Total Bilirubin 0.3 0.0 - 1.2 mg/dL   GFR, Estimated 43 (L) >60 mL/min    Comment: (NOTE) Calculated using the CKD-EPI Creatinine Equation (2021)    Anion gap 12 5 - 15    Comment: Performed at [facility], [address], [place], [place] [address]  CBC     Status: Abnormal   Collection Time: 12/19/23  6:55 PM  Result Value Ref Range   WBC 6.1 4.0 - 10.5 K/uL   RBC 5.80 4.22 - 5.81 MIL/uL   Hemoglobin 15.4 13.0 - 17.0 g/dL   HCT 55.6 60.9 - 47.9 %   MCV 76.4 (L) 80.0 - 100.0 fL   MCH 26.6 26.0 - 34.0 pg   MCHC 34.8 30.0 - 36.0 g/dL   RDW 87.4 88.4 - 84.4 %   Platelets 223 150 - 400 K/uL   nRBC 0.0 0.0 - 0.2 %    Comment: Performed at [facility], [address] [place], [place] [address]  Urinalysis, Routine w reflex microscopic -Urine, Clean Catch     Status: Abnormal   Collection Time: 12/19/23  6:55 PM  Result Value Ref Range   Color, Urine COLORLESS (A) YELLOW   APPearance CLEAR CLEAR   Specific Gravity, Urine 1.010 1.005 - 1.030   pH 5.5 5.0 - 8.0   Glucose, UA NEGATIVE NEGATIVE mg/dL   Hgb urine dipstick NEGATIVE NEGATIVE   Bilirubin Urine NEGATIVE NEGATIVE   Ketones, ur NEGATIVE NEGATIVE mg/dL   Protein, ur  NEGATIVE NEGATIVE mg/dL   Nitrite NEGATIVE NEGATIVE   Leukocytes,Ua NEGATIVE NEGATIVE    Comment: Performed at [facility], [address] [place], [place] [address]  Troponin T, High Sensitivity     Status: Abnormal   Collection Time: 12/19/23  6:55 PM  Result Value Ref Range  Troponin T High Sensitivity 26 (H) <19 ng/L    Comment: (NOTE) Biotin concentrations > 1000 ng/mL falsely decrease TnT results.  Serial cardiac troponin measurements are suggested.  Refer to the Links section for chest pain algorithms and additional  guidance. Performed at [facility], [address], [place], [place] [address]   Troponin T, High Sensitivity     Status: Abnormal   Collection Time: 12/19/23  8:48 PM  Result Value Ref Range   Troponin T High Sensitivity 22 (H) <19 ng/L    Comment: (NOTE) Biotin concentrations > 1000 ng/mL falsely decrease TnT results.  Serial cardiac troponin measurements are suggested.  Refer to the Links section for chest pain algorithms and additional  guidance. Performed at [facility], [address] [place], [place] [address]   Lactic acid, plasma     Status: None   Collection Time: 12/19/23  9:18 PM  Result Value Ref Range   Lactic Acid, Venous 0.8 0.5 - 1.9 mmol/L    Comment: Performed at [facility], [address] [place], [place] [address]  Comprehensive metabolic panel     Status: Abnormal   Collection Time: 12/20/23  5:39 AM  Result Value Ref Range   Sodium 138 135 - 145 mmol/L   Potassium 4.1 3.5 - 5.1 mmol/L   Chloride 108 98 - 111 mmol/L   CO2 23 22 - 32 mmol/L   Glucose, Bld 92 70 - 99 mg/dL    Comment: Glucose reference range applies only to samples taken after fasting for at least 8 hours.   BUN 14 8 - 23 mg/dL   Creatinine, Ser 8.47 (H) 0.61 - 1.24 mg/dL   Calcium 8.9 8.9 - 89.6 mg/dL   Total Protein 6.3 (L) 6.5 - 8.1 g/dL   Albumin 3.6 3.5 - 5.0 g/dL   AST  16 15 - 41 U/L   ALT 11 0 - 44 U/L   Alkaline Phosphatase 53 38 - 126 U/L   Total Bilirubin 1.0 0.0 - 1.2 mg/dL   GFR, Estimated 49 (L) >60 mL/min    Comment: (NOTE) Calculated using the CKD-EPI Creatinine Equation (2021)    Anion gap 7 5 - 15    Comment: Performed at [facility], [address], [place], [place] [address]  CBC     Status: Abnormal   Collection Time: 12/20/23  5:39 AM  Result Value Ref Range   WBC 7.2 4.0 - 10.5 K/uL   RBC 5.54 4.22 - 5.81 MIL/uL   Hemoglobin 14.8 13.0 - 17.0 g/dL   HCT 56.4 60.9 - 47.9 %   MCV 78.5 (L) 80.0 - 100.0 fL   MCH 26.7 26.0 - 34.0 pg   MCHC 34.0 30.0 - 36.0 g/dL   RDW 87.2 88.4 - 84.4 %   Platelets 206 150 - 400 K/uL   nRBC 0.0 0.0 - 0.2 %    Comment: Performed at [facility], [address], [place], [place] [address]   CT ABDOMEN PELVIS W CONTRAST Result Date: 12/19/2023 CLINICAL DATA:  Left upper quadrant pain nausea EXAM: CT ABDOMEN AND PELVIS WITH CONTRAST TECHNIQUE: Multidetector CT imaging of the abdomen and pelvis was performed using the standard protocol following bolus administration of intravenous contrast. RADIATION DOSE REDUCTION: This exam was performed according to the departmental dose-optimization program which includes automated exposure control, adjustment of the mA and/or kV according to patient size and/or use of iterative reconstruction technique. CONTRAST:  85mL OMNIPAQUE  IOHEXOL  300  MG/ML  SOLN COMPARISON:  CT 04/05/2015 FINDINGS: Lower chest: Lung bases demonstrate no acute airspace disease. Micro nodularity at the bases, likely post inflammatory or post infectious. 5 mm left lower lobe pulmonary nodule on series 3, image 3. Hepatobiliary: Small calcified gallstones. Left hepatic lobe cyst. Subcentimeter hypodensity in the right hepatic lobe too small to further characterize. No biliary dilatation Pancreas: Unremarkable. No pancreatic ductal dilatation or surrounding inflammatory  changes. Spleen: Normal in size without focal abnormality. Adrenals/Urinary Tract: Adrenal glands are normal. Kidneys show no hydronephrosis. Small cyst in the left kidney, no imaging follow-up recommended. Urinary bladder is diffusely thick walled. Stomach/Bowel: Stomach nonenlarged. Large stool burden suggesting constipation. Postsurgical changes at the ileocecal region. Some fluid-filled left mid to upper quadrant small bowel, measuring up to 3 cm. This is just upstream of a short segment of bowel within a small umbilical hernia. Otherwise no bowel distension is seen. Vascular/Lymphatic: Aortic atherosclerosis. No enlarged abdominal or pelvic lymph nodes. 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 Other: Negative for pelvic effusion or free air. Musculoskeletal: Multilevel degenerative changes. No acute osseous abnormality IMPRESSION: 1. Large stool burden suggesting constipation. A few fluid-filled left mid to upper quadrant small bowel loops, measuring up to 3 cm. This is just upstream of a short segment small bowel containing umbilical hernia. No adverse features at the hernia sac such as stranding or fluid, but partial or low-grade small bowel obstruction is not excluded. 2. 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 Diffusely thick walled urinary bladder, question cystitis versus chronic bladder outlet obstruction. 3. Cholelithiasis. 4. 5 mm left lower lobe pulmonary nodule. No follow-up needed if patient is low-risk.This recommendation follows the consensus statement: Guidelines for Management of Incidental Pulmonary Nodules Detected on CT Images: From the Fleischner Society 2017; Radiology 2017; 284:228-243. 5. Aortic atherosclerosis. Aortic Atherosclerosis (ICD10-I70.0). Electronically Signed   By: Luke Bun M.D.   On: 12/19/2023 20:09   DG Chest 2 View Result Date: 12/19/2023 CLINICAL DATA:  Pain. Left upper quadrant abdominal pain. Lightheaded and nauseous. EXAM:  CHEST - 2 VIEW COMPARISON:  09/21/2006 FINDINGS: Normal heart size and pulmonary vascularity. No focal airspace disease or consolidation in the lungs. No blunting of costophrenic angles. No pneumothorax. Mediastinal contours appear intact. Degenerative changes in the spine and shoulders. IMPRESSION: No active cardiopulmonary disease. Electronically Signed   By: Elsie Gravely M.D.   On: 12/19/2023 20:08      Assessment/Plan Constipation/small umbilical hernia The patient has been seen, examined, labs, vitals, chart, and imaging personally reviewed.  He appears to be symptomatic from his constipation.  He did have a small loop of SB in his umbilical hernia on his CT scan, but this hernia was empty upon palpation this morning.  He has no incarceration symptoms as he is moving his bowels, passing flatus, hungry, and with no nausea, or pain at his umbilicus.  I have discussed a SMOG enema with him to help get things moving from below which will ultimately be quicker and likely more effective in getting things moving.  He is agreeable as he would like to move his bowels and go home.  I have also written for a diet.  I have discussed this patient with the primary service.  No surgical indications.  We will be available if needed.   FEN - regular VTE - per medicine ID - none needed  I reviewed ED provider notes, hospitalist notes, last 24 h vitals and pain scores, last 24 h labs  and trends, and last 24 h imaging results.  Burnard FORBES Banter, [facility] 12/20/2023, 8:27 AM Please see Amion for pager number during day hours 7:00am-4:30pm or 7:00am -11:30am on weekends

## 2023-12-20 NOTE — Progress Notes (Signed)
   12/20/23 0946  TOC Brief Assessment  Insurance and Status Reviewed  Patient has primary care physician Yes  Home environment has been reviewed home  Prior level of function: independent  Prior/Current Home Services No current home services  Social Drivers of Health Review SDOH reviewed no interventions necessary  Readmission risk has been reviewed Yes  Transition of care needs no transition of care needs at this time

## 2023-12-20 NOTE — Discharge Summary (Signed)
 Physician Discharge Summary  Kyle Vazquez FMW:[id number] DOB: [birth year] DOA: 12/19/2023  PCP: Tanda Prentice DEL, MD  Admit date: 12/19/2023 Discharge date: 12/20/2023  Admitted From: Home Disposition: Home  Recommendations for Outpatient Follow-up:  Follow up with PCP in 1 week with repeat CBC/BMP Outpatient follow-up with general surgery as needed Follow up in ED if symptoms worsen or new appear   Home Health: No Equipment/Devices: None  Discharge Condition: Stable CODE STATUS: Full Diet recommendation: Heart healthy  Brief/Interim Summary:  73 y.o. male with medical history significant of essential hypertension, CKD stage IIIb, asthma, SVT and bigeminy with frequent PVC and colectomy 2023  presented with abdominal pain, distention and nausea with constipation.  On presentation, he had 1 bowel movement after MiraLAX  and docusate.  CT of abdomen and pelvis showed large stool burden suggesting constipation along with findings suggestive of partial or low-grade small bowel obstruction.  He was managed conservatively.  General surgery was consulted.  General surgery has ordered a smog enema today and advancing to regular diet.  He feels much better and wants to go home today.  He will be discharged home today if he tolerates has bowel movement with smog enema.  Discharge Diagnoses:   Constipation Possible low-grade or partial small bowel obstruction in a patient with history of colectomy in 2023 -presented with abdominal pain, distention and nausea with constipation.  On presentation, he had 1 bowel movement after MiraLAX  and docusate.  CT of abdomen and pelvis showed large stool burden suggesting constipation along with findings suggestive of partial or low-grade small bowel obstruction.  He was managed conservatively.  General surgery was consulted.  General surgery has ordered a smog enema today and advancing to regular diet.  He feels much better and wants to go home today.  He will be  discharged home today if he tolerates has bowel movement with smog enema. - Continue MiraLAX , Senokot and lactulose  on discharge.  Outpatient follow-up with general surgery if needed.  Elevated troponin - High sensitive troponins did not trend up.  No chest pain.  No EKG changes.  No further workup needed  Essential hypertension - Continue home regimen.  CKD stage IIIb - Creatinine stable.  Obesity class I - Outpatient follow-up  History of SVT - Currently rate controlled.  Continue beta-blockers  History of asthma -Stable.  Outpatient follow-up   Discharge Instructions  Discharge Instructions     Diet - low sodium heart healthy   Complete by: As directed    Increase activity slowly   Complete by: As directed       Allergies as of 12/20/2023   No Known Allergies      Medication List     STOP taking these medications    docusate sodium  100 MG capsule Commonly known as: COLACE   ferrous sulfate  325 (65 FE) MG tablet Commonly known as: FerrouSul   Pfizer COVID-19 Vac Bivalent injection Generic drug: COVID-19 mRNA bivalent vaccine Proofreader)   Pfizer-BioNT COVID-19 Vac-TriS Susp injection Generic drug: COVID-19 mRNA Vac-TriS Proofreader)       TAKE these medications    amLODipine -benazepril  5-20 MG capsule Commonly known as: LOTREL Take 1 capsule by mouth daily. What changed: Another medication with the same name was removed. Continue taking this medication, and follow the directions you see here.   lactulose  10 GM/15ML solution Commonly known as: CHRONULAC  Take 15 mLs (10 g total) by mouth 2 (two) times daily.   metoprolol  succinate 25 MG 24 hr tablet Commonly known  as: TOPROL -XL Take 37.5 mg by mouth daily.   polyethylene glycol 17 g packet Commonly known as: MIRALAX  / GLYCOLAX  Take 17 g by mouth 2 (two) times daily. What changed: when to take this   senna-docusate 8.6-50 MG tablet Commonly known as: Senokot-S Take 1 tablet by mouth 2 (two) times  daily.        Follow-up Information     Tanda Prentice DEL, MD. Schedule an appointment as soon as possible for a visit in 1 week(s).   Specialty: Family Medicine Contact information: [address] [place] [address] [phone]                No Known Allergies  Consultations: General Surgery   Procedures/Studies: CT ABDOMEN PELVIS W CONTRAST Result Date: 12/19/2023 CLINICAL DATA:  Left upper quadrant pain nausea EXAM: CT ABDOMEN AND PELVIS WITH CONTRAST TECHNIQUE: Multidetector CT imaging of the abdomen and pelvis was performed using the standard protocol following bolus administration of intravenous contrast. RADIATION DOSE REDUCTION: This exam was performed according to the departmental dose-optimization program which includes automated exposure control, adjustment of the mA and/or kV according to patient size and/or use of iterative reconstruction technique. CONTRAST:  85mL OMNIPAQUE  IOHEXOL  300 MG/ML  SOLN COMPARISON:  CT 04/05/2015 FINDINGS: Lower chest: Lung bases demonstrate no acute airspace disease. Micro nodularity at the bases, likely post inflammatory or post infectious. 5 mm left lower lobe pulmonary nodule on series 3, image 3. Hepatobiliary: Small calcified gallstones. Left hepatic lobe cyst. Subcentimeter hypodensity in the right hepatic lobe too small to further characterize. No biliary dilatation Pancreas: Unremarkable. No pancreatic ductal dilatation or surrounding inflammatory changes. Spleen: Normal in size without focal abnormality. Adrenals/Urinary Tract: Adrenal glands are normal. Kidneys show no hydronephrosis. Small cyst in the left kidney, no imaging follow-up recommended. Urinary bladder is diffusely thick walled. Stomach/Bowel: Stomach nonenlarged. Large stool burden suggesting constipation. Postsurgical changes at the ileocecal region. Some fluid-filled left mid to upper quadrant small bowel, measuring up to 3 cm. This is just upstream of a short  segment of bowel within a small umbilical hernia. Otherwise no bowel distension is seen. Vascular/Lymphatic: Aortic atherosclerosis. No enlarged abdominal or pelvic lymph nodes. 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 Other: Negative for pelvic effusion or free air. Musculoskeletal: Multilevel degenerative changes. No acute osseous abnormality IMPRESSION: 1. Large stool burden suggesting constipation. A few fluid-filled left mid to upper quadrant small bowel loops, measuring up to 3 cm. This is just upstream of a short segment small bowel containing umbilical hernia. No adverse features at the hernia sac such as stranding or fluid, but partial or low-grade small bowel obstruction is not excluded. 2. 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 Diffusely thick walled urinary bladder, question cystitis versus chronic bladder outlet obstruction. 3. Cholelithiasis. 4. 5 mm left lower lobe pulmonary nodule. No follow-up needed if patient is low-risk.This recommendation follows the consensus statement: Guidelines for Management of Incidental Pulmonary Nodules Detected on CT Images: From the Fleischner Society 2017; Radiology 2017; 284:228-243. 5. Aortic atherosclerosis. Aortic Atherosclerosis (ICD10-I70.0). Electronically Signed   By: Luke Bun M.D.   On: 12/19/2023 20:09   DG Chest 2 View Result Date: 12/19/2023 CLINICAL DATA:  Pain. Left upper quadrant abdominal pain. Lightheaded and nauseous. EXAM: CHEST - 2 VIEW COMPARISON:  09/21/2006 FINDINGS: Normal heart size and pulmonary vascularity. No focal airspace disease or consolidation in the lungs. No blunting of costophrenic angles. No pneumothorax. Mediastinal contours appear intact. Degenerative changes in  the spine and shoulders. IMPRESSION: No active cardiopulmonary disease. Electronically Signed   By: Elsie Gravely M.D.   On: 12/19/2023 20:08      Subjective: Patient seen and examined at bedside.  Feels much better and  wants to go home today.  Denies worsening abdominal pain or vomiting.  Had bowel movement yesterday.  Discharge Exam: Vitals:   12/20/23 0110 12/20/23 0621  BP: 130/87 (!) 146/85  Pulse: 71 66  Resp: 18 18  Temp: 98.6 F (37 C) 98.4 F (36.9 C)  SpO2: 99% 98%    General: Pt is alert, awake, not in acute distress Cardiovascular: rate controlled, S1/S2 + Respiratory: bilateral decreased breath sounds at bases Abdominal: Soft, obese, NT, mildly distended, bowel sounds + Extremities: no edema, no cyanosis    The results of significant diagnostics from this hospitalization (including imaging, microbiology, ancillary and laboratory) are listed below for reference.     Microbiology: No results found for this or any previous visit (from the past 240 hours).   Labs: BNP (last 3 results) No results for input(s): BNP in the last 8760 hours. Basic Metabolic Panel: Recent Labs  Lab 12/19/23 1855 12/20/23 0539  NA 139 138  K 4.5 4.1  CL 105 108  CO2 22 23  GLUCOSE 96 92  BUN 17 14  CREATININE 1.68* 1.52*  CALCIUM 9.4 8.9   Liver Function Tests: Recent Labs  Lab 12/19/23 1855 12/20/23 0539  AST 23 16  ALT 11 11  ALKPHOS 72 53  BILITOT 0.3 1.0  PROT 7.3 6.3*  ALBUMIN 4.2 3.6   Recent Labs  Lab 12/19/23 1855  LIPASE 44   No results for input(s): AMMONIA in the last 168 hours. CBC: Recent Labs  Lab 12/19/23 1855 12/20/23 0539  WBC 6.1 7.2  HGB 15.4 14.8  HCT 44.3 43.5  MCV 76.4* 78.5*  PLT 223 206   Cardiac Enzymes: No results for input(s): CKTOTAL, CKMB, CKMBINDEX, TROPONINI in the last 168 hours. BNP: Invalid input(s): POCBNP CBG: No results for input(s): GLUCAP in the last 168 hours. D-Dimer No results for input(s): DDIMER in the last 72 hours. Hgb A1c No results for input(s): HGBA1C in the last 72 hours. Lipid Profile No results for input(s): CHOL, HDL, LDLCALC, TRIG, CHOLHDL, LDLDIRECT in the last 72  hours. Thyroid function studies No results for input(s): TSH, T4TOTAL, T3FREE, THYROIDAB in the last 72 hours.  Invalid input(s): FREET3 Anemia work up No results for input(s): VITAMINB12, FOLATE, FERRITIN, TIBC, IRON, RETICCTPCT in the last 72 hours. Urinalysis    Component Value Date/Time   COLORURINE COLORLESS (A) 12/19/2023 1855   APPEARANCEUR CLEAR 12/19/2023 1855   LABSPEC 1.010 12/19/2023 1855   PHURINE 5.5 12/19/2023 1855   GLUCOSEU NEGATIVE 12/19/2023 1855   HGBUR NEGATIVE 12/19/2023 1855   BILIRUBINUR NEGATIVE 12/19/2023 1855   KETONESUR NEGATIVE 12/19/2023 1855   PROTEINUR NEGATIVE 12/19/2023 1855   NITRITE NEGATIVE 12/19/2023 1855   LEUKOCYTESUR NEGATIVE 12/19/2023 1855   Sepsis Labs Recent Labs  Lab 12/19/23 1855 12/20/23 0539  WBC 6.1 7.2   Microbiology No results found for this or any previous visit (from the past 240 hours).   Time coordinating discharge: 35 minutes  SIGNED:   Sophie Mao, MD  [facility] 12/20/2023, 10:15 AM

## 2024-05-17 ENCOUNTER — Emergency Department (HOSPITAL_BASED_OUTPATIENT_CLINIC_OR_DEPARTMENT_OTHER)
Admission: EM | Admit: 2024-05-17 | Discharge: 2024-05-17 | Disposition: A | Discharge status: Home / Self Care | Attending: Emergency Medicine | Admitting: Emergency Medicine

## 2024-05-17 ENCOUNTER — Other Ambulatory Visit: Payer: Self-pay

## 2024-05-17 ENCOUNTER — Emergency Department (HOSPITAL_BASED_OUTPATIENT_CLINIC_OR_DEPARTMENT_OTHER)

## 2024-05-17 ENCOUNTER — Encounter (HOSPITAL_BASED_OUTPATIENT_CLINIC_OR_DEPARTMENT_OTHER): Payer: Self-pay | Admitting: Emergency Medicine

## 2024-05-17 DIAGNOSIS — M79604 Pain in right leg: Secondary | ICD-10-CM | POA: Insufficient documentation

## 2024-05-17 DIAGNOSIS — N289 Disorder of kidney and ureter, unspecified: Secondary | ICD-10-CM

## 2024-05-17 DIAGNOSIS — N189 Chronic kidney disease, unspecified: Secondary | ICD-10-CM | POA: Insufficient documentation

## 2024-05-17 DIAGNOSIS — M79605 Pain in left leg: Secondary | ICD-10-CM | POA: Insufficient documentation

## 2024-05-17 LAB — BASIC METABOLIC PANEL WITH GFR
Anion gap: 10 (ref 5–15)
BUN: 13 mg/dL (ref 8–23)
CO2: 26 mmol/L (ref 22–32)
Calcium: 9.9 mg/dL (ref 8.9–10.3)
Chloride: 102 mmol/L (ref 98–111)
Creatinine, Ser: 1.67 mg/dL — ABNORMAL HIGH (ref 0.61–1.24)
GFR, Estimated: 43 mL/min — ABNORMAL LOW (ref >60)
Glucose, Bld: 119 mg/dL — ABNORMAL HIGH (ref 70–99)
Potassium: 4.5 mmol/L (ref 3.5–5.1)
Sodium: 138 mmol/L (ref 135–145)

## 2024-05-17 LAB — CBC WITH DIFFERENTIAL/PLATELET
Abs Immature Granulocytes: 0.01 K/uL (ref 0.00–0.07)
Basophils Absolute: 0 K/uL (ref 0.0–0.1)
Basophils Relative: 0 %
Eosinophils Absolute: 0.1 K/uL (ref 0.0–0.5)
Eosinophils Relative: 1 %
HCT: 44.3 % (ref 39.0–52.0)
Hemoglobin: 15.5 g/dL (ref 13.0–17.0)
Immature Granulocytes: 0 %
Lymphocytes Relative: 19 %
Lymphs Abs: 1.2 K/uL (ref 0.7–4.0)
MCH: 26.5 pg (ref 26.0–34.0)
MCHC: 35 g/dL (ref 30.0–36.0)
MCV: 75.6 fL — ABNORMAL LOW (ref 80.0–100.0)
Monocytes Absolute: 0.4 K/uL (ref 0.1–1.0)
Monocytes Relative: 6 %
Neutro Abs: 4.7 K/uL (ref 1.7–7.7)
Neutrophils Relative %: 74 %
Platelets: 216 K/uL (ref 150–400)
RBC: 5.86 MIL/uL — ABNORMAL HIGH (ref 4.22–5.81)
RDW: 12.4 % (ref 11.5–15.5)
WBC: 6.4 K/uL (ref 4.0–10.5)
nRBC: 0 % (ref 0.0–0.2)

## 2024-05-17 LAB — CK: Total CK: 174 U/L (ref 49–397)

## 2024-05-17 MED ORDER — CYCLOBENZAPRINE HCL 10 MG PO TABS: 10.0000 mg | ORAL_TABLET | Freq: Two times a day (BID) | ORAL | 0 refills | Status: AC | PRN

## 2024-05-17 NOTE — ED Provider Notes (Signed)
 [facility] AT [facility] Provider Note   CSN: Vazquez Arrival date & time: 05/17/24  1504     Patient presents with: Leg Pain   Kyle Vazquez is a 73 y.o. male.   Patient presents with intermittent stiffness, muscle spasm in lower extremities mostly anterior not lasting long.  Patient does not feel cold in the legs no history of vascular disease.  Patient is high blood pressure history and recently has been more elevated he is unable to get appointment with his primary doctor until February.  No fevers or chills.  No abdominal pain or back pain no weakness or numbness in lower extremities.  No blood clot history of recent surgery.  At times with activity.  No shortness of breath or chest pain.  Patient is on blood thinners.  The history is provided by the patient.  Leg Pain Associated symptoms: no back pain, no fever and no neck pain        Prior to Admission medications   Medication Sig Start Date End Date Taking? Authorizing Provider  cyclobenzaprine  (FLEXERIL ) 10 MG tablet Take 1 tablet (10 mg total) by mouth 2 (two) times daily as needed for muscle spasms. 05/17/24  Yes Tonia Chew, MD  amLODipine -benazepril  (LOTREL) 5-20 MG capsule Take 1 capsule by mouth daily. 03/28/23   [provider]  lactulose  (CHRONULAC ) 10 GM/15ML solution Take 15 mLs (10 g total) by mouth 2 (two) times daily. 12/20/23   Cheryle Page, MD  metoprolol  succinate (TOPROL -XL) 25 MG 24 hr tablet Take 37.5 mg by mouth daily. 03/28/23   [provider]  polyethylene glycol (MIRALAX  / GLYCOLAX ) 17 g packet Take 17 g by mouth 2 (two) times daily. 12/20/23   Cheryle Page, MD    Allergies: Patient has no known allergies.    Review of Systems  Constitutional:  Negative for chills and fever.  HENT:  Negative for congestion.   Eyes:  Negative for visual disturbance.  Respiratory:  Negative for shortness of breath.   Cardiovascular:  Negative for chest pain.   Gastrointestinal:  Negative for abdominal pain and vomiting.  Genitourinary:  Negative for dysuria and flank pain.  Musculoskeletal:  Positive for arthralgias. Negative for back pain, neck pain and neck stiffness.  Skin:  Negative for rash.  Neurological:  Negative for light-headedness and headaches.    Updated Vital Signs BP (!) 158/84 (BP Location: Right Arm)   Pulse 65   Temp 98.1 F (36.7 C) (Oral)   Resp 20   Ht 5' 10 (1.778 m)   Wt 105 kg   SpO2 98%   BMI 33.21 kg/m   Physical Exam Vitals and nursing note reviewed.  Constitutional:      General: He is not in acute distress.    Appearance: He is well-developed.  HENT:     Head: Normocephalic and atraumatic.     Mouth/Throat:     Mouth: Mucous membranes are moist.  Eyes:     General:        Right eye: No discharge.        Left eye: No discharge.     Conjunctiva/sclera: Conjunctivae normal.  Neck:     Trachea: No tracheal deviation.  Cardiovascular:     Rate and Rhythm: Normal rate and regular rhythm.     Heart sounds: No murmur heard.    Comments: Patient has 2+ distal pulses dorsalis pedis and posterior tibial bilateral. Pulmonary:     Effort: Pulmonary effort is normal.  Breath sounds: Normal breath sounds.  Abdominal:     General: There is no distension.     Palpations: Abdomen is soft.     Tenderness: There is no abdominal tenderness. There is no guarding.  Musculoskeletal:     Cervical back: Normal range of motion and neck supple. No rigidity.     Comments: Patient has minimal discomfort anterior lower extremities adjacent to tibia.  Compartments soft, no calf tenderness and no edema bilateral.  Lower extremities warm bilateral well-perfused.  Skin:    General: Skin is warm.     Capillary Refill: Capillary refill takes less than 2 seconds.     Findings: No rash.  Neurological:     General: No focal deficit present.     Mental Status: He is alert.     GCS: GCS eye subscore is 4. GCS verbal  subscore is 5. GCS motor subscore is 6.     Cranial Nerves: No cranial nerve deficit.     Comments: Equal strength grossly upper and lower extremities.  Psychiatric:        Mood and Affect: Mood normal.     (all labs ordered are listed, but only abnormal results are displayed) Labs Reviewed  CBC WITH DIFFERENTIAL/PLATELET - Abnormal; Notable for the following components:      Result Value   RBC 5.86 (*)    MCV 75.6 (*)    All other components within normal limits  BASIC METABOLIC PANEL WITH GFR - Abnormal; Notable for the following components:   Glucose, Bld 119 (*)    Creatinine, Ser 1.67 (*)    GFR, Estimated 43 (*)    All other components within normal limits  CK    EKG: None  Radiology: US  Venous Img Lower Bilateral (DVT) Result Date: 05/17/2024 CLINICAL DATA:  Leg pain for 10 days EXAM: BILATERAL LOWER EXTREMITY VENOUS DOPPLER ULTRASOUND TECHNIQUE: Gray-scale sonography with compression, as well as color and duplex ultrasound, were performed to evaluate the deep venous system(s) from the level of the common femoral vein through the popliteal and proximal calf veins. COMPARISON:  None Available. FINDINGS: VENOUS Normal compressibility of the common femoral, superficial femoral, and popliteal veins, as well as the visualized calf veins. Visualized portions of profunda femoral vein and great saphenous vein unremarkable. No filling defects to suggest DVT on grayscale or color Doppler imaging. Doppler waveforms show normal direction of venous flow, normal respiratory plasticity and response to augmentation. OTHER None. Limitations: none IMPRESSION: 1. No evidence of deep venous thrombosis within either lower extremity. Electronically Signed   By: Ozell Daring M.D.   On: 05/17/2024 20:30     Procedures   Medications Ordered in the ED - No data to display                                  Medical Decision Making Amount and/or Complexity of Data Reviewed Labs:  ordered.  Risk Prescription drug management.   Patient presents with primarily intermittent leg cramps/focal discomfort in the legs possibly spasm like for the past 10 days.  Patient has no evidence clinically of acute vascular event, well-perfused distally, no focal weakness.  No cardiopulmonary signs or symptoms.  Differential broad including muscle spasm, neuropathy, vascular disease, blood pressure related, musculoskeletal, other.  Patient well-appearing in the ED, reassessment blood pressure elevated patient is on blood pressure medicines.  Blood work independent reviewed chronic renal failure kidney function 1.67 electrolytes  unremarkable normal white count.  Ultrasound results and phone reviewed no DVT.  Patient stable for outpatient follow-up with primary doctor or sports medicine.     Final diagnoses:  Lower extremity pain, bilateral  Chronic renal impairment, unspecified CKD stage    ED Discharge Orders          Ordered    cyclobenzaprine  (FLEXERIL ) 10 MG tablet  2 times daily PRN        05/17/24 2102               Tonia Chew, MD 05/17/24 2106

## 2024-05-17 NOTE — Discharge Instructions (Signed)
 Use Tylenol  and Motrin  as needed for pain. For muscle spasm you can use heat pad or we can try Flexeril  but it can make you sleepy. Follow-up with your primary doctor for next steps in management.

## 2024-05-17 NOTE — ED Triage Notes (Signed)
 Bilateral feet and leg pain Stiffness X 10 days Yesterday difficulty focusing eyes, lasted about 10 minutes, face feeling flush.

## 2024-05-17 NOTE — ED Notes (Signed)
 Pt left prior to this RN assessing patient. Per Tonia MD, he spoke with patient and provided d/c instructions. Pt left prior to d/c instruction and disposition placed into epic by MD.

## 2024-05-24 ENCOUNTER — Ambulatory Visit

## 2024-05-24 DIAGNOSIS — Z9889 Other specified postprocedural states: Secondary | ICD-10-CM | POA: Diagnosis not present

## 2024-05-24 DIAGNOSIS — M62462 Contracture of muscle, left lower leg: Secondary | ICD-10-CM | POA: Diagnosis not present

## 2024-05-24 DIAGNOSIS — M62461 Contracture of muscle, right lower leg: Secondary | ICD-10-CM | POA: Diagnosis not present

## 2024-05-24 DIAGNOSIS — M7741 Metatarsalgia, right foot: Secondary | ICD-10-CM | POA: Diagnosis not present

## 2024-05-24 DIAGNOSIS — M7742 Metatarsalgia, left foot: Secondary | ICD-10-CM

## 2024-05-24 DIAGNOSIS — M2141 Flat foot [pes planus] (acquired), right foot: Secondary | ICD-10-CM | POA: Diagnosis not present

## 2024-05-24 DIAGNOSIS — M2142 Flat foot [pes planus] (acquired), left foot: Secondary | ICD-10-CM | POA: Diagnosis not present

## 2024-05-24 MED ORDER — METHYLPREDNISOLONE 4 MG PO TBPK: ORAL_TABLET | ORAL | 0 refills | Status: AC

## 2024-05-24 NOTE — Progress Notes (Unsigned)
 Subjective:  Patient ID: Kyle Vazquez, male    DOB: 1951-05-05,  MRN: [id number]  Chief Complaint  Patient presents with   Foot Pain    Rm17 Patient complains of pain bilateral across ball of foot for several months/ pain radiates up ankles.    Discussed the use of AI scribe software for clinical note transcription with the patient, who gave verbal consent to proceed.  History of Present Illness Kyle Vazquez is a 73 year old male who presents with bilateral foot stiffness and discomfort.  He notes stiffness across the front of both feet, described as tightness and warmth, most noticeable in the mornings before walking. It is more irritating than painful and feels like a tightening.  He had a severe left Achilles tendon rupture 25 years ago treated surgically, with persistent tightness in the Achilles region since. The current stiffness across the front of both feet is similar on both sides despite the prior repair.  He also describes a burning sensation under the ball of his foot.   His primary care physician recently diagnosed B12 and magnesium deficiencies on lab work.  He is an avid golfer and recently had golf lessons but has not played in the last four weeks.     Review of Systems: Negative except as noted in the HPI. Denies N/V/F/Ch.  Past Medical History:  Diagnosis Date   Asthma    Hypertension    Current Medications[1]  Tobacco Use History[2]  Allergies[3] Objective:   Constitutional Well developed. Well nourished. Oriented to person, place, and time.  Vascular Dorsalis pedis pulses palpable bilaterally. Posterior tibial pulses palpable bilaterally. Capillary refill normal to all digits.  No cyanosis or clubbing noted. Pedal hair growth normal.  Neurologic Normal speech. Epicritic sensation to light touch grossly intact bilaterally. Negative tinel sign at tarsal tunnel bilaterally.   Dermatologic Skin texture and turgor are within  normal limits.  No open wounds. No skin lesions.  Musculoskeletal: 5/5 muscle strength in all major pedal muscle groups.  Decreased ankle joint dorsiflexion with knee extended, normal with knee flexed indicating gastrosoleus equinus.  No pain with lesser MTP range of motion or with palpation of plantar plates or distal metatarsals.  Negative Mulder click.  No pain with palpation of subtalar joint or hindfoot joints.   Radiographs: Taken and reviewed.  3 views bilateral feet.  These demonstrate pes planus foot shape with decreased space at the subtalar joint which is nonpainful clinically.  Increased talonavicular joint uncoverage.  Left ankle demonstrates healed fracture.  Decreased calcaneal inclination.  No acute osseous findings such as fracture or dislocation.     Assessment:   1. Metatarsalgia of left foot   2. Metatarsalgia of right foot   3. Gastrocnemius equinus, bilateral   4. H/O Achilles tendon repair   5. Bilateral pes planus      Plan:  Patient was evaluated and treated and all questions answered.  Assessment and Plan Assessment & Plan Bilateral metatarsalgia with biomechanical dysfunction. Likely secondary to equinus Symptoms improve with movement. - Initiated physical therapy for flexibility and balance. Prescription faxed to [facility] in [place] - Applied metatarsal pad for pressure redistribution, consider custom orthotics if patient benefits from metatarsal pads. - Prescribed Medrol  Dosepak for inflammation. - Advised on stretching exercises. - Scheduled follow-up in 6-8 weeks as he goes through PT.  Bilateral Achilles tendon tightness. Has had repair for ruptures achilles on the left ankle Bilateral Achilles tendon tightness post-repair contributes to metatarsalgia.  -  Use physical therapy for tendon lengthening. - Advised on stretching exercises.  Pes planus - Flat feet noted, no current issues.   RTC 6 weeks  Prentice Ovens, DPM [facility] Fellowship  Trained Podiatric Surgeon [facility]     [1]  Current Outpatient Medications:    amLODipine -benazepril  (LOTREL) 5-20 MG capsule, Take 1 capsule by mouth daily., Disp: , Rfl:    cyclobenzaprine  (FLEXERIL ) 10 MG tablet, Take 1 tablet (10 mg total) by mouth 2 (two) times daily as needed for muscle spasms., Disp: 10 tablet, Rfl: 0   lactulose  (CHRONULAC ) 10 GM/15ML solution, Take 15 mLs (10 g total) by mouth 2 (two) times daily., Disp: 473 mL, Rfl: 1   methylPREDNISolone  (MEDROL  DOSEPAK) 4 MG TBPK tablet, 1 pack tapered oral methylprednisolone , Disp: 1 each, Rfl: 0   metoprolol  succinate (TOPROL -XL) 25 MG 24 hr tablet, Take 37.5 mg by mouth daily., Disp: , Rfl:    polyethylene glycol (MIRALAX  / GLYCOLAX ) 17 g packet, Take 17 g by mouth 2 (two) times daily., Disp: , Rfl:  [2]  Social History Tobacco Use  Smoking Status Every Day   Types: Cigars  Smokeless Tobacco Not on file  [3] No Known Allergies

## 2024-07-05 ENCOUNTER — Ambulatory Visit

## 2024-07-11 ENCOUNTER — Ambulatory Visit (INDEPENDENT_AMBULATORY_CARE_PROVIDER_SITE_OTHER)

## 2024-07-11 DIAGNOSIS — M62461 Contracture of muscle, right lower leg: Secondary | ICD-10-CM | POA: Diagnosis not present

## 2024-07-11 DIAGNOSIS — M2141 Flat foot [pes planus] (acquired), right foot: Secondary | ICD-10-CM

## 2024-07-11 DIAGNOSIS — M7742 Metatarsalgia, left foot: Secondary | ICD-10-CM

## 2024-07-11 DIAGNOSIS — M7741 Metatarsalgia, right foot: Secondary | ICD-10-CM | POA: Diagnosis not present

## 2024-07-11 DIAGNOSIS — M62462 Contracture of muscle, left lower leg: Secondary | ICD-10-CM | POA: Diagnosis not present

## 2024-07-11 DIAGNOSIS — M2142 Flat foot [pes planus] (acquired), left foot: Secondary | ICD-10-CM | POA: Diagnosis not present

## 2024-07-11 NOTE — Progress Notes (Signed)
 "  Subjective:  Patient ID: Kyle Vazquez, male    DOB: Aug 15, 1950,  MRN: [id number]  Chief Complaint  Patient presents with   Foot Pain    Rm8 follow up metatarsalgia bilateral feet/ patient says that he has pain in his right shin now and under toes.    Discussed the use of AI scribe software for clinical note transcription with the patient, who gave verbal consent to proceed.  History of Present Illness Kyle Vazquez is a 74 year old male with bilateral metatarsalgia, gastrocnemius equinus, and pes planus who presents for follow-up of persistent lower leg and foot pain and stiffness.  He has daily stiffness and tightness along the plantar forefoot beneath the toes and in the shins, worst on waking. Morning pain is up to 7/10 and improves to about 2/10 after roughly one hour of walking. Discomfort is mainly stiffness and tightness.  Calf stretching and exercises have improved mobility and reduced morning stiffness. He uses a smart watch reminder to move every 50 minutes but remains largely sedentary at home due to difficulty with stairs.  He has trialed multiple orthotic inserts. A thinner insert with built-in metatarsal pads provided relief, whereas a thicker insert caused shoe tightness and discomfort. Wider shoes to accommodate orthotics have improved comfort.  He does relate to some improvement compared to last visit 6 weeks ago. He states the metatarsal pads have been helpful. Physical Therapy did not reach out to him for scheduling.       Review of Systems: Negative except as noted in the HPI. Denies N/V/F/Ch.  Past Medical History:  Diagnosis Date   Asthma    Hypertension    Current Medications[1]  Tobacco Use History[2]  Allergies[3] Objective:   Constitutional Well developed. Well nourished. Oriented to person, place, and time.  Vascular Dorsalis pedis pulses palpable bilaterally. Posterior tibial pulses palpable bilaterally. Capillary refill  normal to all digits.  No cyanosis or clubbing noted. Pedal hair growth normal.  Neurologic Normal speech. Epicritic sensation to light touch grossly intact bilaterally. Negative tinel sign at tarsal tunnel bilaterally.   Dermatologic Skin texture and turgor are within normal limits.  No open wounds. No skin lesions.  Musculoskeletal: 5/5 muscle strength in all major pedal muscle groups.  Decreased ankle joint dorsiflexion with knee extended, normal with knee flexed indicating gastrosoleus equinus.  No pain with lesser MTP range of motion or with palpation of plantar plates or distal metatarsals.  Negative Mulder click.  No pain with palpation of subtalar joint or hindfoot joints.      Assessment:   1. Metatarsalgia of left foot   2. Metatarsalgia of right foot   3. Gastrocnemius equinus, bilateral   4. Bilateral pes planus      Plan:  Patient was evaluated and treated and all questions answered.  Assessment and Plan Assessment & Plan Bilateral metatarsalgia Chronic bilateral metatarsalgia with episodic pain, improving with orthotic pads, insoles, activity, and stretching. Conservative management remains appropriate. - Continue orthotic pads and insoles, adjust for optimal fit. We discussed using his orthotic with the build in metatarsal pad.  - Regular stretching and exercise, especially in the morning and after inactivity. He does not feel that he needs formal physical therapy after researching stretches - Acetaminophen  for pain, naproxen for inflammation. - Maintain activity within limitations, monitor symptoms. - Guidance on purchasing orthotic inserts online.  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 No formal physical therapy needed. -  Continue daily calf stretching, especially in the morning and during stiffness. - Online physical therapy exercises confirmed beneficial. - Use reminders  to move regularly.  Bilateral pes planus Bilateral pes planus managed with wider shoes and high arch orthotic inserts, improving comfort. Conservative management remains appropriate. - Continue wider shoes and orthotic inserts. - Anticipatory guidance on activity modification and footwear selection.  RTC PRN   Prentice Ovens, DPM AACFAS Fellowship Trained Podiatric Surgeon [facility]     [1]  Current Outpatient Medications:    amLODipine -benazepril  (LOTREL) 5-20 MG capsule, Take 1 capsule by mouth daily., Disp: , Rfl:    cyclobenzaprine  (FLEXERIL ) 10 MG tablet, Take 1 tablet (10 mg total) by mouth 2 (two) times daily as needed for muscle spasms., Disp: 10 tablet, Rfl: 0   lactulose  (CHRONULAC ) 10 GM/15ML solution, Take 15 mLs (10 g total) by mouth 2 (two) times daily., Disp: 473 mL, Rfl: 1   methylPREDNISolone  (MEDROL  DOSEPAK) 4 MG TBPK tablet, 1 pack tapered oral methylprednisolone , Disp: 1 each, Rfl: 0   metoprolol  succinate (TOPROL -XL) 25 MG 24 hr tablet, Take 37.5 mg by mouth daily., Disp: , Rfl:    polyethylene glycol (MIRALAX  / GLYCOLAX ) 17 g packet, Take 17 g by mouth 2 (two) times daily., Disp: , Rfl:  [2]  Social History Tobacco Use  Smoking Status Every Day   Types: Cigars  Smokeless Tobacco Not on file  [3] No Known Allergies  "
